# Patient Record
Sex: Male | Born: 1955
Health system: Southern US, Community
[De-identification: ages and names within clinical notes are randomized; demographics above are authoritative.]

## PROBLEM LIST (undated history)

## (undated) DIAGNOSIS — M1990 Unspecified osteoarthritis, unspecified site: Secondary | ICD-10-CM

## (undated) DIAGNOSIS — G4730 Sleep apnea, unspecified: Secondary | ICD-10-CM

## (undated) DIAGNOSIS — N189 Chronic kidney disease, unspecified: Secondary | ICD-10-CM

## (undated) DIAGNOSIS — E119 Type 2 diabetes mellitus without complications: Secondary | ICD-10-CM

## (undated) DIAGNOSIS — M109 Gout, unspecified: Secondary | ICD-10-CM

## (undated) DIAGNOSIS — K219 Gastro-esophageal reflux disease without esophagitis: Secondary | ICD-10-CM

## (undated) DIAGNOSIS — R51 Headache: Secondary | ICD-10-CM

## (undated) DIAGNOSIS — I10 Essential (primary) hypertension: Secondary | ICD-10-CM

## (undated) HISTORY — DX: Sleep apnea, unspecified: G47.30

## (undated) HISTORY — PX: OTHER SURGICAL HISTORY: SHX169

## (undated) HISTORY — PX: HERNIA REPAIR: SHX51

## (undated) HISTORY — DX: Gout, unspecified: M10.9

---

## 1999-08-21 ENCOUNTER — Encounter: Payer: Self-pay | Admitting: Urology

## 1999-08-22 ENCOUNTER — Encounter: Payer: Self-pay | Admitting: Urology

## 1999-08-22 ENCOUNTER — Ambulatory Visit (HOSPITAL_COMMUNITY): Admission: RE | Admit: 1999-08-22 | Discharge: 1999-08-22 | Payer: Self-pay | Admitting: Urology

## 2004-04-29 ENCOUNTER — Ambulatory Visit (HOSPITAL_COMMUNITY): Admission: RE | Admit: 2004-04-29 | Discharge: 2004-04-29 | Payer: Self-pay | Admitting: Family Medicine

## 2006-11-10 ENCOUNTER — Ambulatory Visit (HOSPITAL_BASED_OUTPATIENT_CLINIC_OR_DEPARTMENT_OTHER): Admission: RE | Admit: 2006-11-10 | Discharge: 2006-11-10 | Payer: Self-pay | Admitting: Urology

## 2010-12-15 ENCOUNTER — Encounter
Admission: RE | Admit: 2010-12-15 | Discharge: 2010-12-15 | Payer: Self-pay | Source: Home / Self Care | Attending: Urology | Admitting: Urology

## 2011-01-02 ENCOUNTER — Ambulatory Visit
Admission: RE | Admit: 2011-01-02 | Discharge: 2011-01-02 | Payer: Self-pay | Source: Home / Self Care | Attending: Urology | Admitting: Urology

## 2011-01-30 ENCOUNTER — Ambulatory Visit: Payer: Self-pay | Admitting: Urology

## 2011-04-24 NOTE — Op Note (Signed)
William Harvey, William Harvey               ACCOUNT NO.:  [id number]   MEDICAL RECORD NO.:  [id number]          PATIENT TYPE:  AMB   LOCATION:  [facility]                         FACILITY:  [facility]   PHYSICIAN:  Excell Seltzer. Annabell Howells, M.D.    DATE OF BIRTH:  December 30, 1955   DATE OF PROCEDURE:  11/10/2006  DATE OF DISCHARGE:                               OPERATIVE REPORT   PROCEDURE:  Right ureteroscopic stone extraction.   PREOPERATIVE DIAGNOSIS:  Right ureterovesical stone.   POSTOPERATIVE DIAGNOSIS:  Right ureterovesical stone.   SURGEON:  Excell Seltzer. Annabell Howells, M.D.   ANESTHESIA:  General.   SPECIMEN:  Stone which was given to the patient.   COMPLICATIONS:  None.   INDICATIONS:  [name] is a 55 year old white male with a right UVJ  stone that has not changed its location for seven days.  He has had  intermittent pain and elected ureteroscopic stone extraction.   FINDINGS OF PROCEDURE:  The patient was given Cipro and was taken to the  operating room where a general anesthetic was induced.  He was placed in  the lithotomy position.  His perineum and genitalia were prepped with  Betadine solution.  He was draped in the usual sterile fashion.   Ureteroscopy was performed using the 6-French short scope which was  passed per urethra.  Inspection revealed a stone crowning at the right  ureteral orifices.  It was flipped into the bladder.  The ureteroscope  was then passed into the beyond the orifice.  No residual fragments were  noted.  No significant inflammatory changes were noted except at the  meatus and it was not felt a stent was needed.   The ureteroscope was removed.  A basket was passed, a fragment of the  stone was removed with the basket.  I then inserted the cystoscope  sheath with a 12 degrees lens.  Examination revealed a normal urethra,  the external sphincter was intact.  The prostatic urethra was short  without obstruction.  The bladder wall was smooth and pale with one area  of hemorrhagic  change where the bladder would have rested on the tip of  the stone.  No other abnormalities  were noted.  The remainder of the stone was then evacuated from the  bladder which was drained.  The patient was taken down from lithotomy  position, his anesthetic was reversed.  He was moved to the recovery  room in stable condition.  There were no complications.      Excell Seltzer. Annabell Howells, M.D.  Electronically Signed     JJW/MEDQ  D:  11/10/2006  T:  11/10/2006  Job:  [id number]   cc:   Fara Chute  Fax: [phone]

## 2012-11-30 IMAGING — CT CT ABD-PELV W/O CM
2 of 4 series · 17 of 46 positions shown, 19 images · non-contrast
Comparison: CT from [HOSPITAL] on 04/07/2006

CLINICAL DATA: Diffuse abdominal pain.  Gross hematuria.
Urolithiasis.

CT ABDOMEN AND PELVIS WITHOUT CONTRAST
TECHNIQUE: Multidetector CT imaging of the abdomen and pelvis was
performed following the standard protocol without intravenous
contrast.

[Series 2: renal stone w/o · axial · non-contrast · 0.80mm/px · z∈[-435,-10]mm · 14 of 95 slices shown, 16 images]
[im 5/95  soft-tissue]
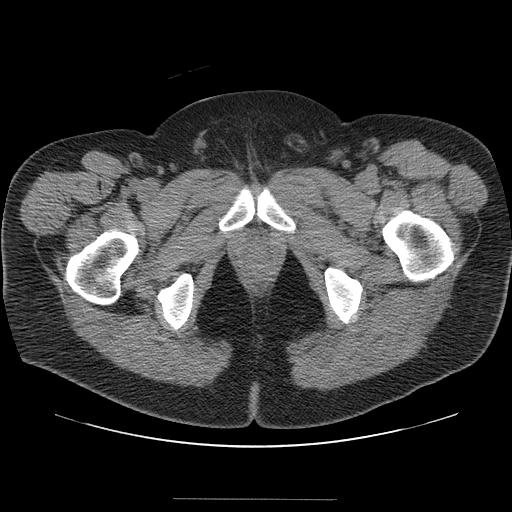
[im 5/95  bone]
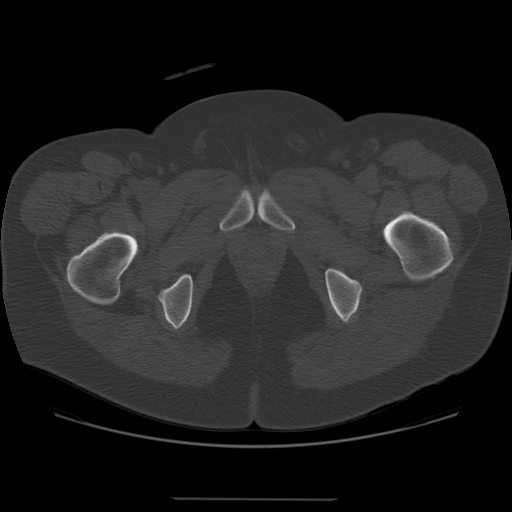
[im 13/95  soft-tissue]
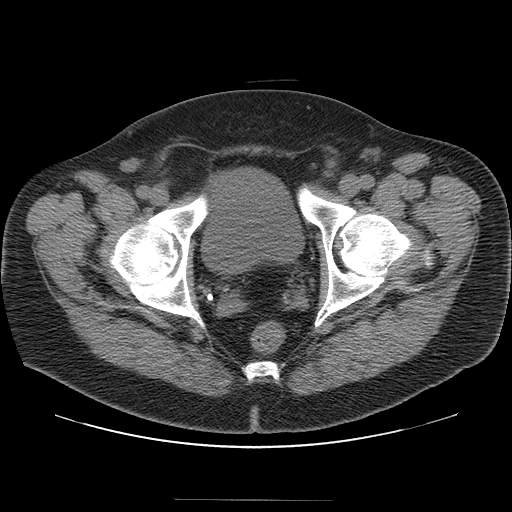
[im 17/95  soft-tissue]
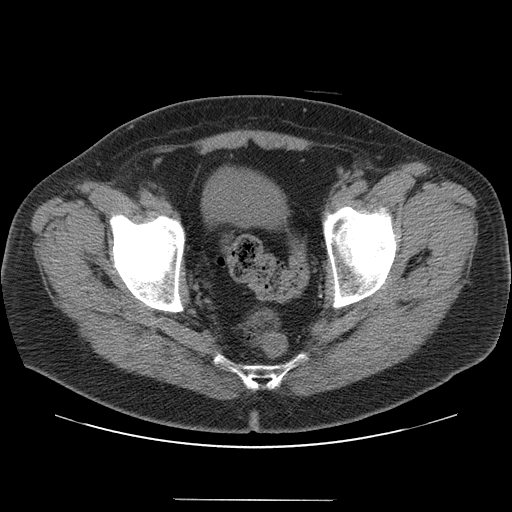
[im 25/95  soft-tissue]
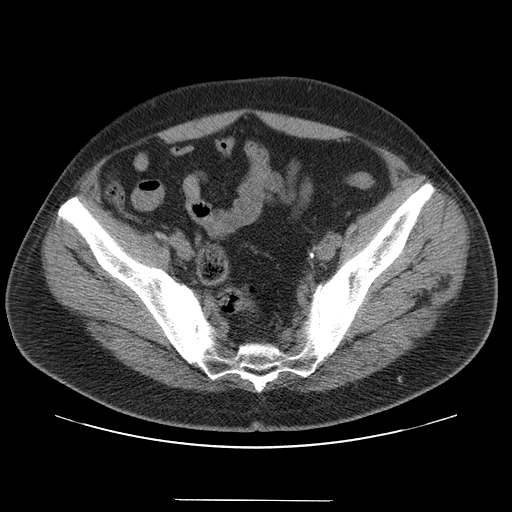
[im 33/95  soft-tissue]
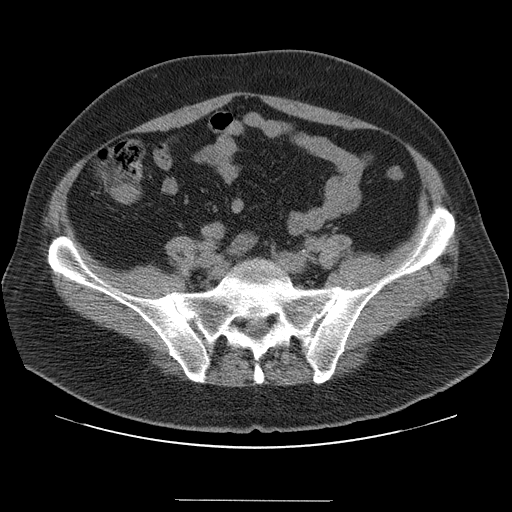
[im 37/95  soft-tissue]
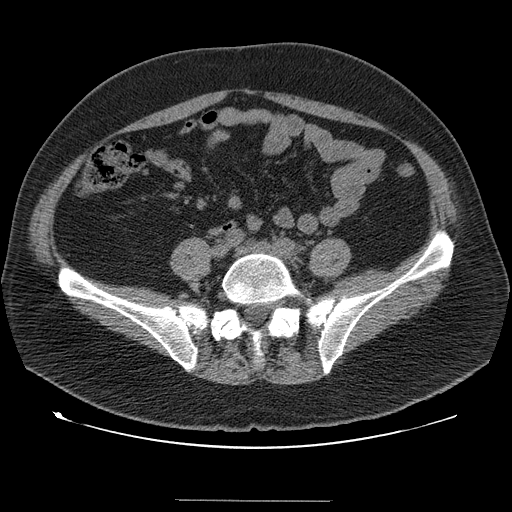
[im 45/95  soft-tissue]
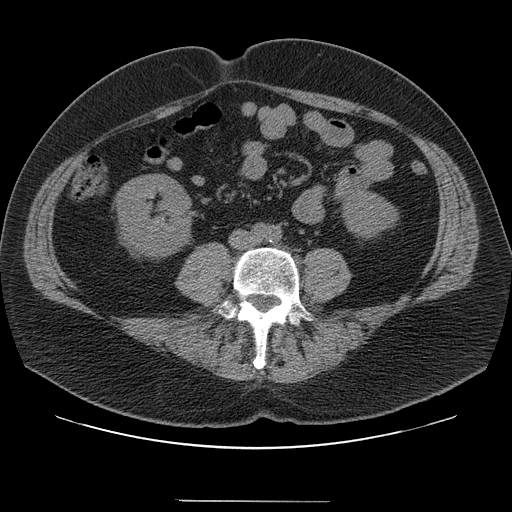
[im 50/95  soft-tissue]
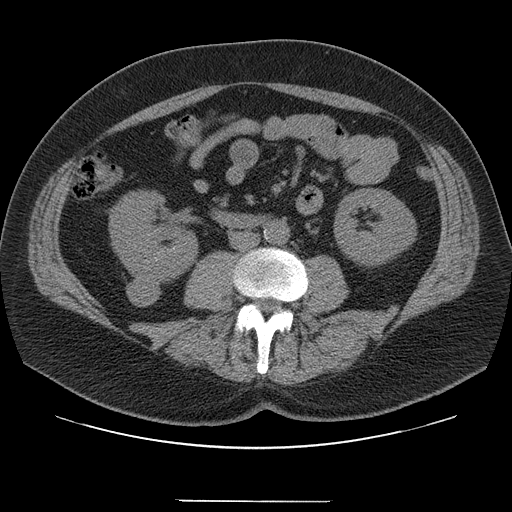
[im 58/95  soft-tissue]
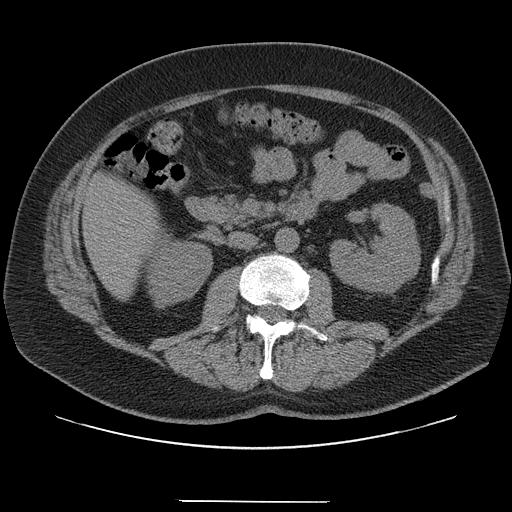
[im 58/95  bone]
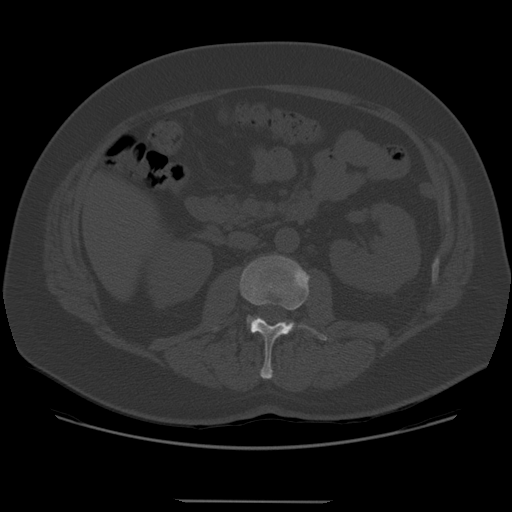
[im 62/95  soft-tissue]
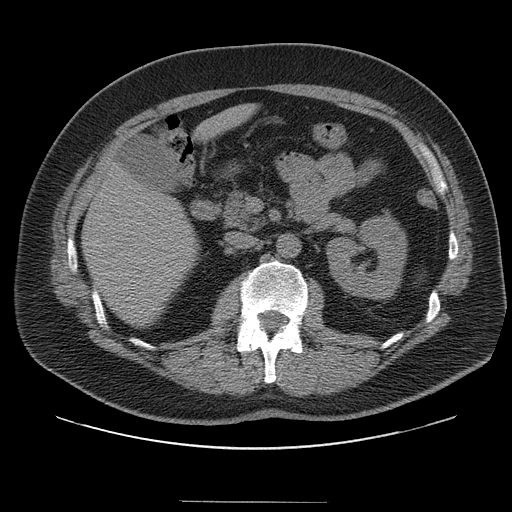
[im 70/95  soft-tissue]
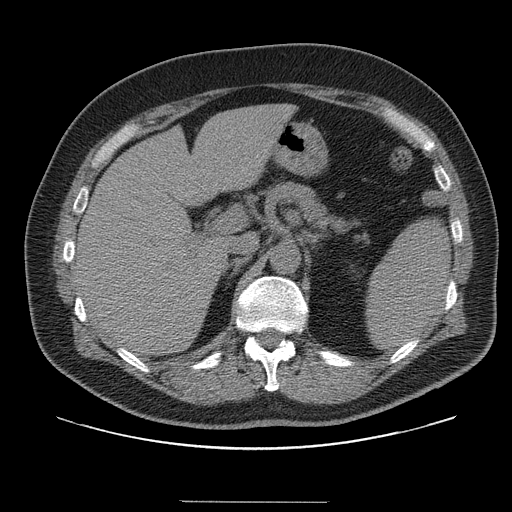
[im 78/95  soft-tissue]
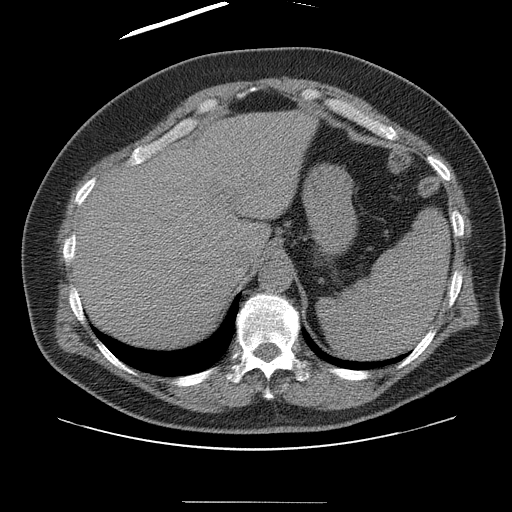
[im 82/95  soft-tissue]
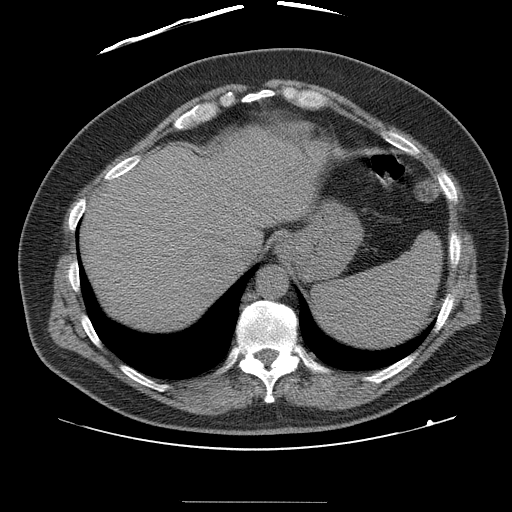
[im 90/95  soft-tissue]
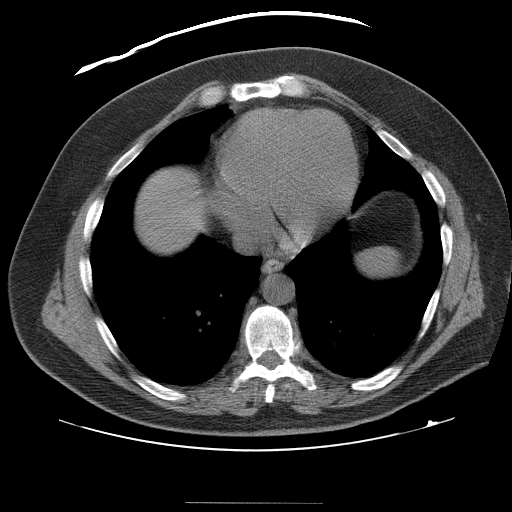

[Series 400: cor · coronal · 1.01mm/px · 3 of 132 slices shown]
[im 44/132  soft-tissue]
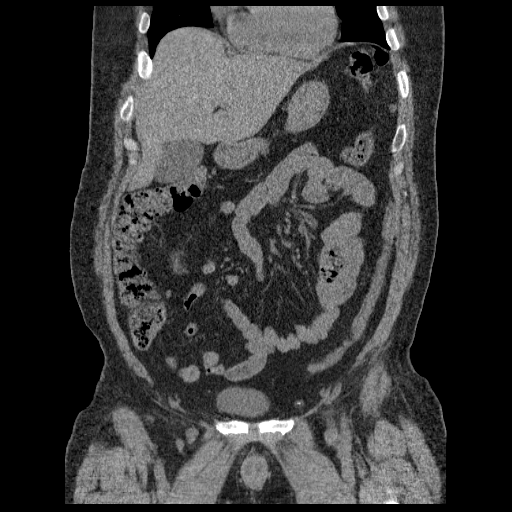
[im 59/132  soft-tissue]
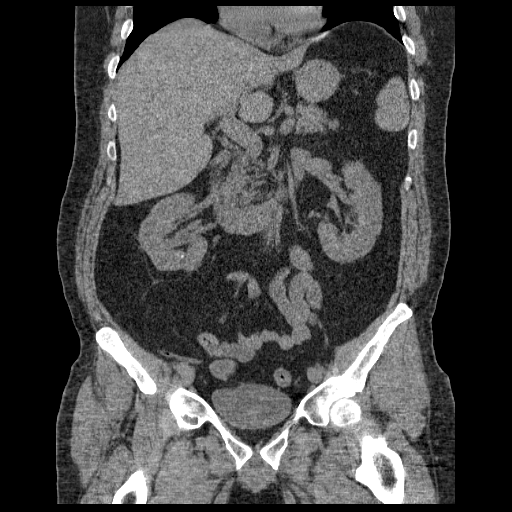
[im 73/132  soft-tissue]
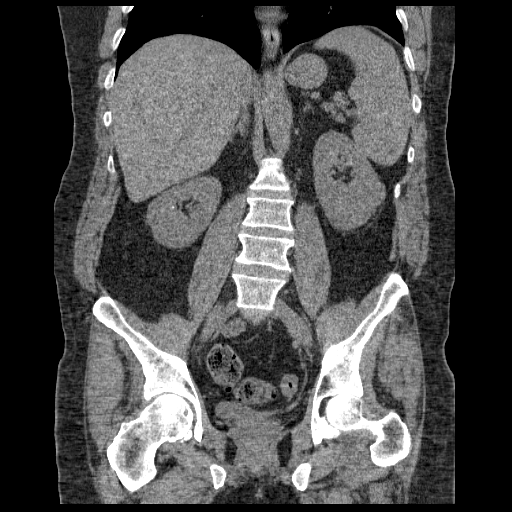

[17 of 46 positions shown; findings below may reference images not displayed]

FINDINGS: Tiny less than 5 mm calculi are noted in both kidneys,
however there is no evidence of ureteral calculi or hydronephrosis.

A 2.5 cm hyperdense cyst is again seen extending exophytically from
the posterior mid pole of the right kidney.  A smaller 1.3 cm
hyperdense subcapsular cyst is again seen along the posterior upper
pole of the left kidney.  No definite renal masses are identified
on this noncontrast exam.  No evidence of perinephric inflammatory
changes or fluid collections.

The other abdominal parenchymal organs have a normal appearance on
this noncontrast study.  Gallbladder is unremarkable.  No soft
tissue masses or lymphadenopathy identified within the abdomen or
pelvis.

There is no evidence of inflammatory process or abnormal fluid
collections.  Normal appendix is visualized.  Urinary bladder
appears normal in this noncontrast study.
IMPRESSION: 1.  No acute  findings.
2.  Nonobstructing bilateral nephrolithiasis.  No evidence of
ureteral calculi or hydronephrosis.
3.  Stable bilateral hyperdense renal cysts.

## 2013-01-09 ENCOUNTER — Telehealth: Payer: Self-pay | Admitting: Urgent Care

## 2013-01-09 NOTE — Telephone Encounter (Signed)
Called pt to triage. He will call back with med list.

## 2013-01-09 NOTE — Telephone Encounter (Signed)
Pt received a letter to be triaged for a tcs. Please call him back at [phone]

## 2013-01-10 ENCOUNTER — Telehealth: Payer: Self-pay

## 2013-01-10 ENCOUNTER — Other Ambulatory Visit: Payer: Self-pay

## 2013-01-10 DIAGNOSIS — Z139 Encounter for screening, unspecified: Secondary | ICD-10-CM

## 2013-01-10 NOTE — Telephone Encounter (Signed)
See separate triage.  

## 2013-01-10 NOTE — Telephone Encounter (Signed)
OK to proceed with colonoscopy.

## 2013-01-10 NOTE — Telephone Encounter (Signed)
Gastroenterology Pre-Procedure Form    Request Date: 01/10/2013    Requesting Physician: Dr. Regino Schultze     PATIENT INFORMATION:  William Harvey is a 57 y.o., male (DOB=19-Jul-1956).  PROCEDURE: Procedure(s) requested: colonoscopy Procedure Reason: screening for colon cancer  PATIENT REVIEW QUESTIONS: The patient reports the following:   1. Diabetes Melitis: no 2. Joint replacements in the past 12 months: no 3. Major health problems in the past 3 months: no 4. Has an artificial valve or MVP:no 5. Has been advised in past to take antibiotics in advance of a procedure like teeth cleaning: no}    MEDICATIONS & ALLERGIES:    Patient reports the following regarding taking any blood thinners:   Plavix? no Aspirin?no Coumadin?  no  Patient confirms/reports the following medications:  Current Outpatient Prescriptions  Medication Sig Dispense Refill  . lisinopril-hydrochlorothiazide (PRINZIDE,ZESTORETIC) 20-12.5 MG per tablet Take 1 tablet by mouth daily.      . meloxicam (MOBIC) 7.5 MG tablet Take 7.5 mg by mouth 2 (two) times daily.      . naproxen sodium (ANAPROX) 220 MG tablet Take 220 mg by mouth 2 (two) times daily with a meal. Pt takes two Aleve once every morning      . NON FORMULARY Lorcet  10/325 mg       KEEPS ON HAND FOR KIDNEY STONES        Patient confirms/reports the following allergies:  No Known Allergies  Patient is appropriate to schedule for requested procedure(s): yes  AUTHORIZATION INFORMATION Primary Insurance:  ID #:   Group #:  Pre-Cert / Auth required:  Pre-Cert / Auth #:   Secondary Insurance:  ID #:   Group #:  Pre-Cert / Auth required: Pre-Cert / Auth #:   No orders of the defined types were placed in this encounter.    SCHEDULE INFORMATION: Procedure has been scheduled as follows:  Date:   01/24/2013     Time:  10:30 AM Location: [facility] Short Stay  This Gastroenterology Pre-Precedure Form is being routed to the following  provider(s) for review: R. Roetta Sessions, MD

## 2013-01-11 MED ORDER — PEG-KCL-NACL-NASULF-NA ASC-C 100 G PO SOLR: 1.0000 | ORAL | Status: DC

## 2013-01-11 NOTE — Telephone Encounter (Signed)
Rx sent to [facility]. Instructions mailed to pt.

## 2013-01-13 ENCOUNTER — Encounter (HOSPITAL_COMMUNITY): Payer: Self-pay | Admitting: Pharmacy Technician

## 2013-01-24 ENCOUNTER — Encounter (HOSPITAL_COMMUNITY): Payer: Self-pay | Admitting: *Deleted

## 2013-01-24 ENCOUNTER — Ambulatory Visit (HOSPITAL_COMMUNITY)
Admission: RE | Admit: 2013-01-24 | Discharge: 2013-01-24 | Disposition: A | Discharge status: Home / Self Care | Payer: BC Managed Care – PPO | Source: Ambulatory Visit | Attending: Internal Medicine | Admitting: Internal Medicine

## 2013-01-24 ENCOUNTER — Encounter (HOSPITAL_COMMUNITY)
Admission: RE | Disposition: A | Discharge status: Home / Self Care | Payer: Self-pay | Source: Ambulatory Visit | Attending: Internal Medicine

## 2013-01-24 DIAGNOSIS — Z1211 Encounter for screening for malignant neoplasm of colon: Secondary | ICD-10-CM | POA: Insufficient documentation

## 2013-01-24 DIAGNOSIS — K633 Ulcer of intestine: Secondary | ICD-10-CM

## 2013-01-24 DIAGNOSIS — D128 Benign neoplasm of rectum: Secondary | ICD-10-CM

## 2013-01-24 DIAGNOSIS — D129 Benign neoplasm of anus and anal canal: Secondary | ICD-10-CM

## 2013-01-24 DIAGNOSIS — Z139 Encounter for screening, unspecified: Secondary | ICD-10-CM

## 2013-01-24 DIAGNOSIS — I10 Essential (primary) hypertension: Secondary | ICD-10-CM | POA: Insufficient documentation

## 2013-01-24 DIAGNOSIS — K5289 Other specified noninfective gastroenteritis and colitis: Secondary | ICD-10-CM | POA: Insufficient documentation

## 2013-01-24 HISTORY — DX: Chronic kidney disease, unspecified: N18.9

## 2013-01-24 HISTORY — DX: Gastro-esophageal reflux disease without esophagitis: K21.9

## 2013-01-24 HISTORY — DX: Unspecified osteoarthritis, unspecified site: M19.90

## 2013-01-24 HISTORY — DX: Headache: R51

## 2013-01-24 HISTORY — DX: Essential (primary) hypertension: I10

## 2013-01-24 HISTORY — PX: COLONOSCOPY: SHX5424

## 2013-01-24 SURGERY — COLONOSCOPY
Anesthesia: Moderate Sedation

## 2013-01-24 MED ORDER — ONDANSETRON HCL 4 MG/2ML IJ SOLN
INTRAMUSCULAR | Status: DC | PRN
  Administered 2013-01-24: 4 mg via INTRAVENOUS

## 2013-01-24 MED ORDER — ONDANSETRON HCL 4 MG/2ML IJ SOLN
INTRAMUSCULAR | Status: AC
  Filled 2013-01-24: qty 2

## 2013-01-24 MED ORDER — MIDAZOLAM HCL 5 MG/5ML IJ SOLN
INTRAMUSCULAR | Status: DC | PRN
  Administered 2013-01-24: 1 mg via INTRAVENOUS
  Administered 2013-01-24 (×3): 2 mg via INTRAVENOUS

## 2013-01-24 MED ORDER — MIDAZOLAM HCL 5 MG/5ML IJ SOLN
INTRAMUSCULAR | Status: AC
  Filled 2013-01-24: qty 10

## 2013-01-24 MED ORDER — MEPERIDINE HCL 100 MG/ML IJ SOLN
INTRAMUSCULAR | Status: DC | PRN
  Administered 2013-01-24: 25 mg via INTRAVENOUS
  Administered 2013-01-24 (×2): 50 mg via INTRAVENOUS
  Administered 2013-01-24: 25 mg via INTRAVENOUS

## 2013-01-24 MED ORDER — MEPERIDINE HCL 100 MG/ML IJ SOLN
INTRAMUSCULAR | Status: AC
  Filled 2013-01-24: qty 1

## 2013-01-24 MED ORDER — STERILE WATER FOR IRRIGATION IR SOLN
Status: DC | PRN
  Administered 2013-01-24: 10:00:00

## 2013-01-24 MED ORDER — SODIUM CHLORIDE 0.45 % IV SOLN
INTRAVENOUS | Status: DC
  Administered 2013-01-24: 1000 mL via INTRAVENOUS

## 2013-01-24 NOTE — Op Note (Signed)
[facility] [address] [place], [address]   COLONOSCOPY PROCEDURE REPORT  PATIENT: William Harvey, William Harvey  MR#:         [id number] BIRTHDATE: 08-10-1956 , 56  yrs. old GENDER: Male ENDOSCOPIST: R.  Roetta Sessions, MD FACP FACG REFERRED BY:  Karleen Hampshire, M.D. PROCEDURE DATE:  01/24/2013 PROCEDURE:     ileocolonoscopy with ileal biopsy, snare polypectomy and polyp ablation  INDICATIONS: First ever average risk screening colonoscopy  INFORMED CONSENT:  The risks, benefits, alternatives and imponderables including but not limited to bleeding, perforation as well as the possibility of a missed lesion have been reviewed.  The potential for biopsy, lesion removal, etc. have also been discussed.  Questions have been answered.  All parties agreeable. Please see the history and physical in the medical record for more information.  MEDICATIONS: Versed 7 mg IV and Demerol 150 mg IV in divided doses. Zofran 4 mg IV  DESCRIPTION OF PROCEDURE:  After a digital rectal exam was performed, the Pentax Colonoscope [id number]  colonoscope was advanced from the anus through the rectum and colon to the area of the cecum, ileocecal valve and appendiceal orifice.  The cecum was deeply intubated.  These structures were well-seen and photographed for the record.  From the level of the cecum and ileocecal valve, the scope was slowly and cautiously withdrawn.  The mucosal surfaces were carefully surveyed utilizing scope tip deflection to facilitate fold flattening as needed.  The scope was pulled down into the rectum where a thorough examination including retroflexion was performed.    FINDINGS:  Adequate preparation. (1) 5 mm pedunculated polyp just inside the anal verge at 2 cm. (2) adjacent diminutive polyps in the distal rectum as well. The remainder of the rectal and colonic mucosa appeared normal. The patient had multiple 1-3 mm ulcers in the terminal ileum.  THERAPEUTIC /  DIAGNOSTIC MANEUVERS PERFORMED:  The terminal ileum was biopsied. The larger rectal polyp was hot snare removed. The 2 adjacent polyps were ablated with the tip of a hot snare loop.  COMPLICATIONS: None  CECAL WITHDRAWAL TIME:  15 minute  IMPRESSION:  Ileal ulcers-likely related to nonsteroidal use-status post biopsy. Rectal polyps removed and/or ablated as described above.  RECOMMENDATIONS: Followup on pathology.   _______________________________ eSigned:  R. Roetta Sessions, MD FACP [facility] 01/24/2013 11:19 AM   CC:    PATIENT NAME:  [name], [name] MR#: [id number]
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
## 2013-01-26 ENCOUNTER — Encounter: Payer: Self-pay | Admitting: *Deleted

## 2013-01-26 ENCOUNTER — Encounter: Payer: Self-pay | Admitting: Internal Medicine

## 2013-01-27 ENCOUNTER — Encounter (HOSPITAL_COMMUNITY): Payer: Self-pay | Admitting: Internal Medicine

## 2013-12-25 ENCOUNTER — Other Ambulatory Visit (HOSPITAL_COMMUNITY): Payer: Self-pay | Admitting: "Endocrinology

## 2013-12-25 DIAGNOSIS — E049 Nontoxic goiter, unspecified: Secondary | ICD-10-CM

## 2014-01-02 ENCOUNTER — Ambulatory Visit (HOSPITAL_COMMUNITY)
Admission: RE | Admit: 2014-01-02 | Discharge: 2014-01-02 | Disposition: A | Discharge status: Home / Self Care | Payer: BC Managed Care – PPO | Source: Ambulatory Visit | Attending: "Endocrinology | Admitting: "Endocrinology

## 2014-01-02 ENCOUNTER — Ambulatory Visit (HOSPITAL_COMMUNITY): Payer: BC Managed Care – PPO

## 2014-01-02 DIAGNOSIS — E049 Nontoxic goiter, unspecified: Secondary | ICD-10-CM

## 2014-01-02 DIAGNOSIS — E042 Nontoxic multinodular goiter: Secondary | ICD-10-CM | POA: Insufficient documentation

## 2014-01-08 ENCOUNTER — Other Ambulatory Visit (HOSPITAL_COMMUNITY): Payer: Self-pay | Admitting: "Endocrinology

## 2014-01-08 DIAGNOSIS — E042 Nontoxic multinodular goiter: Secondary | ICD-10-CM

## 2014-01-10 ENCOUNTER — Encounter (HOSPITAL_COMMUNITY): Payer: Self-pay

## 2014-01-10 ENCOUNTER — Encounter (HOSPITAL_COMMUNITY)
Admission: RE | Admit: 2014-01-10 | Discharge: 2014-01-10 | Disposition: A | Discharge status: Home / Self Care | Payer: BC Managed Care – PPO | Source: Ambulatory Visit | Attending: "Endocrinology | Admitting: "Endocrinology

## 2014-01-10 DIAGNOSIS — E042 Nontoxic multinodular goiter: Secondary | ICD-10-CM | POA: Insufficient documentation

## 2014-01-10 MED ORDER — SODIUM IODIDE I 131 CAPSULE
9.0000 | Freq: Once | INTRAVENOUS | Status: AC | PRN
  Administered 2014-01-10: 9 via ORAL

## 2014-01-11 ENCOUNTER — Encounter (HOSPITAL_COMMUNITY)
Admission: RE | Admit: 2014-01-11 | Discharge: 2014-01-11 | Disposition: A | Discharge status: Home / Self Care | Payer: BC Managed Care – PPO | Source: Ambulatory Visit | Attending: "Endocrinology | Admitting: "Endocrinology

## 2014-01-11 MED ORDER — SODIUM PERTECHNETATE TC 99M INJECTION
10.0000 | Freq: Once | INTRAVENOUS | Status: AC | PRN
  Administered 2014-01-11: 10 via INTRAVENOUS

## 2014-01-17 ENCOUNTER — Other Ambulatory Visit (HOSPITAL_COMMUNITY): Payer: Self-pay | Admitting: "Endocrinology

## 2014-01-17 DIAGNOSIS — R221 Localized swelling, mass and lump, neck: Secondary | ICD-10-CM

## 2014-01-25 ENCOUNTER — Other Ambulatory Visit (HOSPITAL_COMMUNITY): Payer: Self-pay | Admitting: "Endocrinology

## 2014-01-25 ENCOUNTER — Encounter (HOSPITAL_COMMUNITY): Payer: Self-pay

## 2014-01-25 ENCOUNTER — Ambulatory Visit (HOSPITAL_COMMUNITY)
Admission: RE | Admit: 2014-01-25 | Discharge: 2014-01-25 | Disposition: A | Discharge status: Home / Self Care | Payer: BC Managed Care – PPO | Source: Ambulatory Visit | Attending: "Endocrinology | Admitting: "Endocrinology

## 2014-01-25 DIAGNOSIS — E049 Nontoxic goiter, unspecified: Secondary | ICD-10-CM | POA: Insufficient documentation

## 2014-01-25 DIAGNOSIS — R221 Localized swelling, mass and lump, neck: Secondary | ICD-10-CM

## 2014-01-25 MED ORDER — LIDOCAINE HCL (PF) 2 % IJ SOLN
10.0000 mL | Freq: Once | INTRAMUSCULAR | Status: AC
  Administered 2014-01-25: 10 mL

## 2014-01-25 MED ORDER — LIDOCAINE HCL (PF) 2 % IJ SOLN
INTRAMUSCULAR | Status: AC
  Administered 2014-01-25: 10 mL
  Filled 2014-01-25: qty 10

## 2014-01-25 NOTE — Discharge Instructions (Signed)
Thyroid Biopsy °The thyroid gland is a butterfly-shaped gland situated in the front of the neck. It produces hormones which affect metabolism, growth and development, and body temperature. A thyroid biopsy is a procedure in which small samples of tissue or fluid are removed from the thyroid gland or mass and examined under a microscope. This test is done to determine the cause of thyroid problems, such as infection, cancer, or other thyroid problems. °There are 2 ways to obtain samples: °1. Fine needle biopsy. Samples are removed using a thin needle inserted through the skin and into the thyroid gland or mass. °2. Open biopsy. Samples are removed after a cut (incision) is made through the skin. °LET YOUR CAREGIVER KNOW ABOUT:  °· Allergies. °· Medications taken including herbs, eye drops, over-the-counter medications, and creams. °· Use of steroids (by mouth or creams). °· Previous problems with anesthetics or numbing medicine. °· Possibility of pregnancy, if this applies. °· History of blood clots (thrombophlebitis). °· History of bleeding or blood problems. °· Previous surgery. °· Other health problems. °RISKS AND COMPLICATIONS °· Bleeding from the site. The risk of bleeding is higher if you have a bleeding disorder or are taking any blood thinning medications (anticoagulants). °· Infection. °· Injury to structures near the thyroid gland. °BEFORE THE PROCEDURE  °This is a procedure that can be done as an outpatient. Confirm the time that you need to arrive for your procedure. Confirm whether there is a need to fast or withhold any medications. A blood sample may be done to determine your blood clotting time. Medicine may be given to help you relax (sedative). °PROCEDURE °Fine needle biopsy. °You will be awake during the procedure. You may be asked to lie on your back with your head tipped backward to extend your neck. Let your caregiver know if you cannot tolerate the positioning. An area on your neck will be  cleansed. A needle is inserted through the skin of your neck. You may feel a mild discomfort during this procedure. You may be asked to avoid coughing, talking, swallowing, or making sounds during some portions of the procedure. The needle is withdrawn once tissue or fluid samples have been removed. Pressure may be applied to the neck to reduce swelling and ensure that bleeding has stopped. The samples will be sent for examination.  °Open biopsy. °You will be given general anesthesia. You will be asleep during the procedure. An incision is made in your neck. A sample of thyroid tissue or the mass is removed. The tissue sample or mass will be sent for examination. The sample or mass may be examined during the biopsy. If the sample or mass contains cancer cells, some or all of the thyroid gland may be removed. The incision is closed with stitches. °AFTER THE PROCEDURE  °Your recovery will be assessed and monitored. If there are no problems, as an outpatient, you should be able to go home shortly after the procedure. °If you had a fine needle biopsy: °· You may have soreness at the biopsy site for 1 to 2 days. °If you had an open biopsy:  °· You may have soreness at the biopsy site for 3 to 4 days. °· You may have a hoarse voice or sore throat for 1 to 2 days. °Obtaining the Test Results °It is your responsibility to obtain your test results. Do not assume everything is normal if you have not heard from your caregiver or the medical facility. It is important for you to follow up   on all of your test results. °HOME CARE INSTRUCTIONS  °· Keeping your head raised on a pillow when you are lying down may ease biopsy site discomfort. °· Supporting the back of your head and neck with both hands as you sit up from a lying position may ease biopsy site discomfort. °· Only take over-the-counter or prescription medicines for pain, discomfort, or fever as directed by your caregiver. °· Throat lozenges or gargling with warm salt  water may help to soothe a sore throat. °SEEK IMMEDIATE MEDICAL CARE IF:  °· You have severe bleeding from the biopsy site. °· You have difficulty swallowing. °· You have a fever. °· You have increased pain, swelling, redness, or warmth at the biopsy site. °· You notice pus coming from the biopsy site. °· You have swollen glands (lymph nodes) in your neck. °Document Released: 09/20/2007 Document Revised: 03/20/2013 Document Reviewed: 02/20/2009 °ExitCare® Patient Information ©2014 [employer]. ° °

## 2014-01-25 NOTE — Procedures (Signed)
PreOperative Dx: Multiple thyroid nodules, dominant mass inferior pole right lobe Postoperative Dx: Multiple thyroid nodules, dominant mass inferior pole right lobe Procedure:   US guided FNA of right lower pole thyroid nodule Radiologist:  Thornton Papas Anesthesia:  5 ml of 2 lidocaine Specimen:  FNA x 4  EBL:   < 1 ml Complications: None

## 2014-01-25 NOTE — Progress Notes (Signed)
Biopsy complete no signs of distress  

## 2014-02-05 ENCOUNTER — Other Ambulatory Visit (HOSPITAL_COMMUNITY): Payer: Self-pay | Admitting: "Endocrinology

## 2014-02-05 DIAGNOSIS — E042 Nontoxic multinodular goiter: Secondary | ICD-10-CM

## 2014-02-08 ENCOUNTER — Other Ambulatory Visit (HOSPITAL_COMMUNITY)
Admission: RE | Admit: 2014-02-08 | Discharge: 2014-02-08 | Disposition: A | Discharge status: Home / Self Care | Payer: BC Managed Care – PPO | Source: Ambulatory Visit | Attending: Diagnostic Radiology | Admitting: Diagnostic Radiology

## 2014-02-08 ENCOUNTER — Ambulatory Visit (HOSPITAL_COMMUNITY): Payer: BC Managed Care – PPO

## 2014-02-08 ENCOUNTER — Ambulatory Visit
Admission: RE | Admit: 2014-02-08 | Discharge: 2014-02-08 | Disposition: A | Discharge status: Home / Self Care | Payer: BC Managed Care – PPO | Source: Ambulatory Visit | Attending: "Endocrinology | Admitting: "Endocrinology

## 2014-02-08 DIAGNOSIS — E049 Nontoxic goiter, unspecified: Secondary | ICD-10-CM | POA: Insufficient documentation

## 2014-02-08 DIAGNOSIS — E042 Nontoxic multinodular goiter: Secondary | ICD-10-CM

## 2016-01-11 IMAGING — US US THYROID BIOPSY
1 series · 13 of 25 positions shown · non-contrast
Comparison: Thyroid ultrasound 01/02/2014

ADDENDUM:
Cytology results reviewed.

Pathology results are nondiagnostic, blood with insufficient
epithelial cells noted on specimen.
This is despite utilizing 22 gauge spinal needles in order to reach
the center of the inferior pole right lobe mass secondary to nodule
depth and neck morphology.
Mass remains indeterminate; recommend referral of patient to
CLINICAL DATA: Multinodular thyroid gland with a dominant solid
mass at the inferior pole of the right lobe
EXAM:
ULTRASOUND GUIDED NEEDLE ASPIRATE BIOPSY OF THE THYROID GLAND

[Series 1: us thyroid biopsy · 0.08mm/px · 26 acquisitions, 13 frames shown]
[im 1/26]
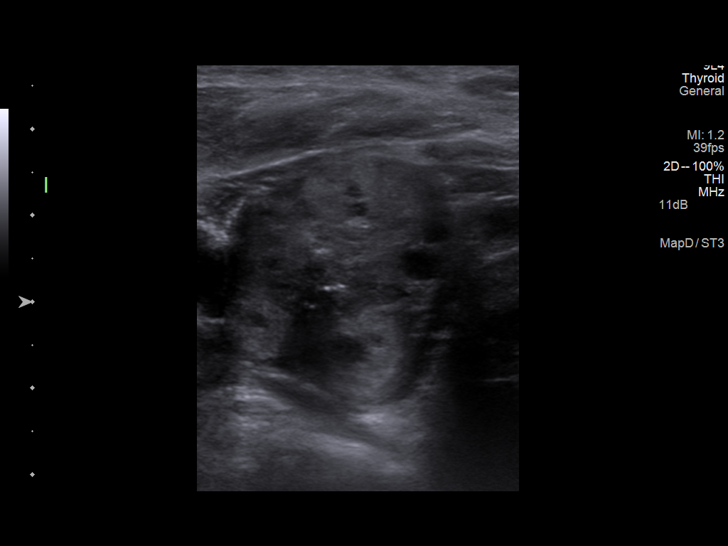
[im 3/26]
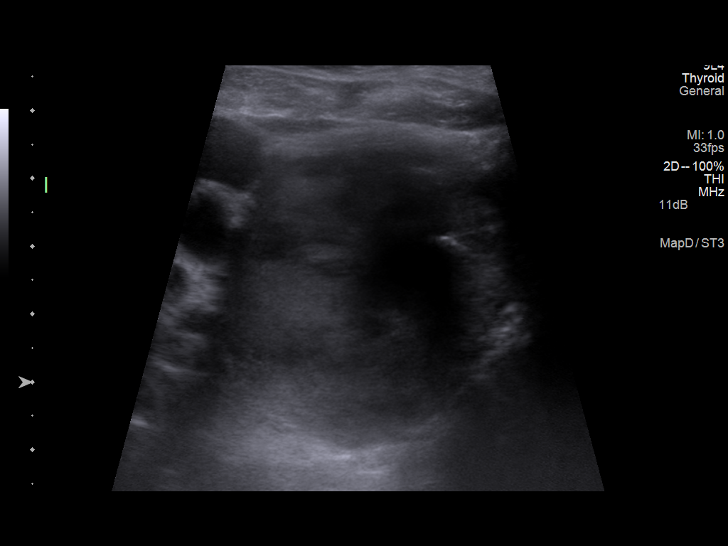
[im 5/26]
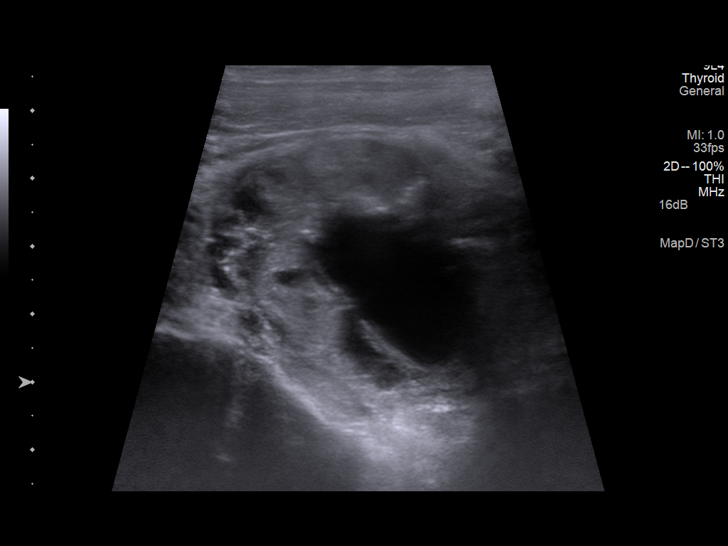
[im 7/26]
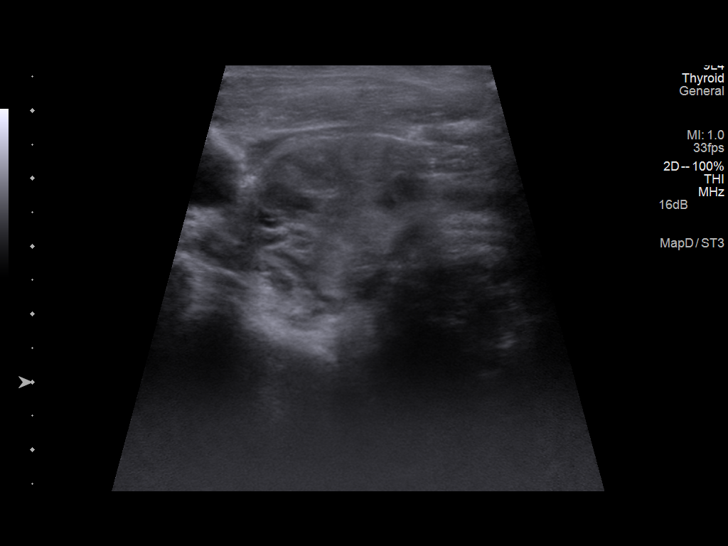
[im 9/26]
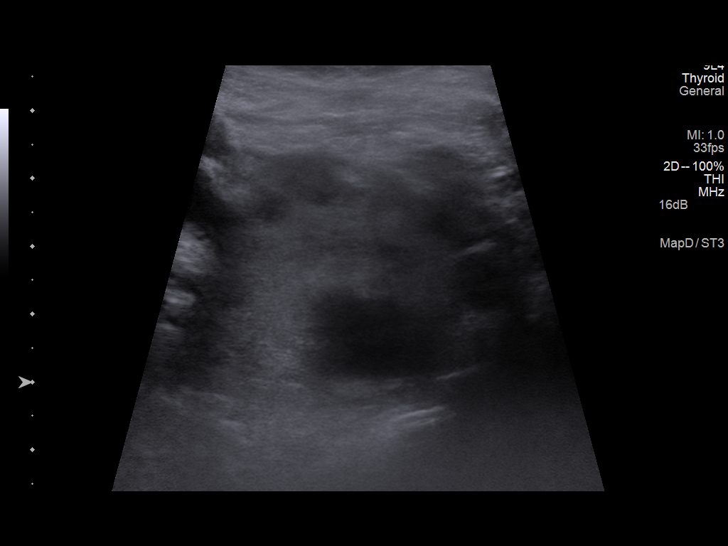
[im 11/26]
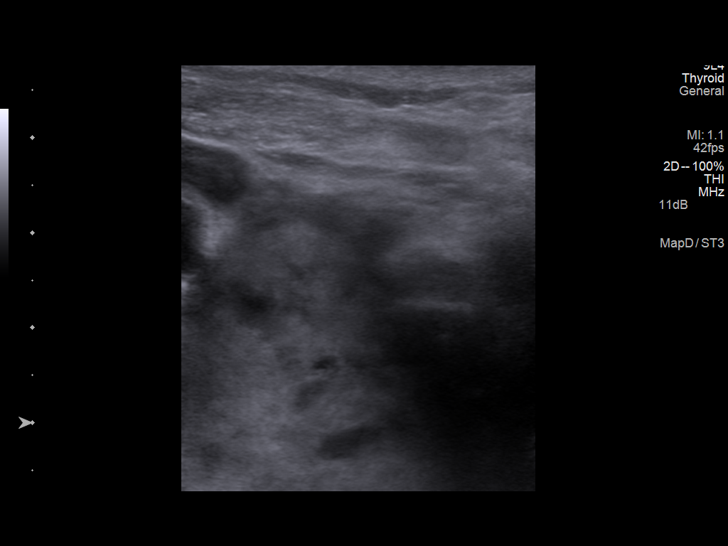
[im 13/26]
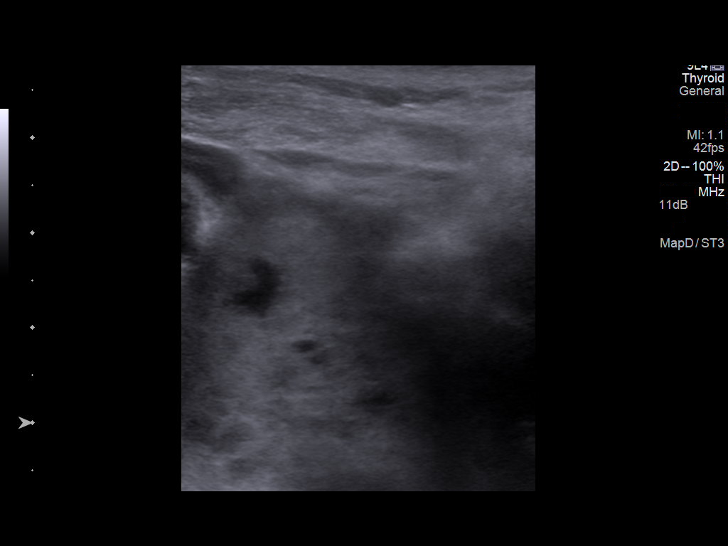
[im 15/26]
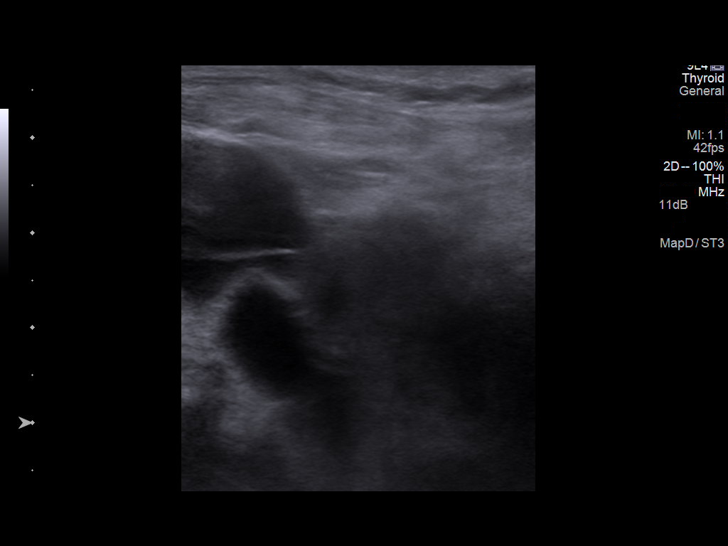
[im 17/26]
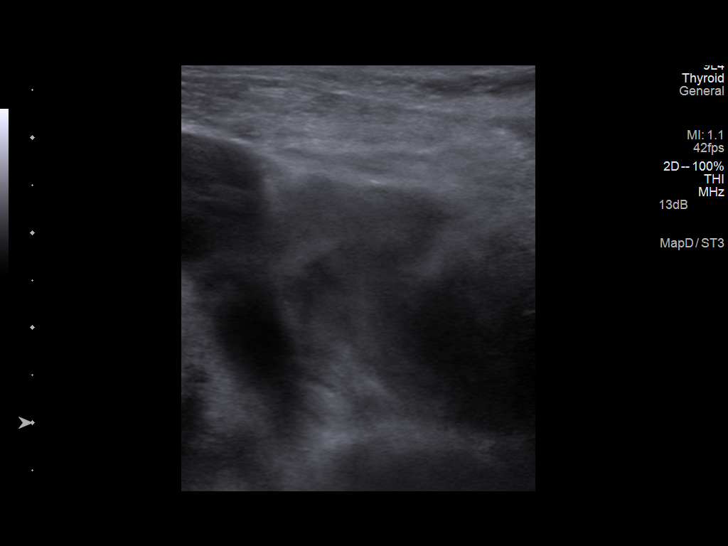
[im 19/26]
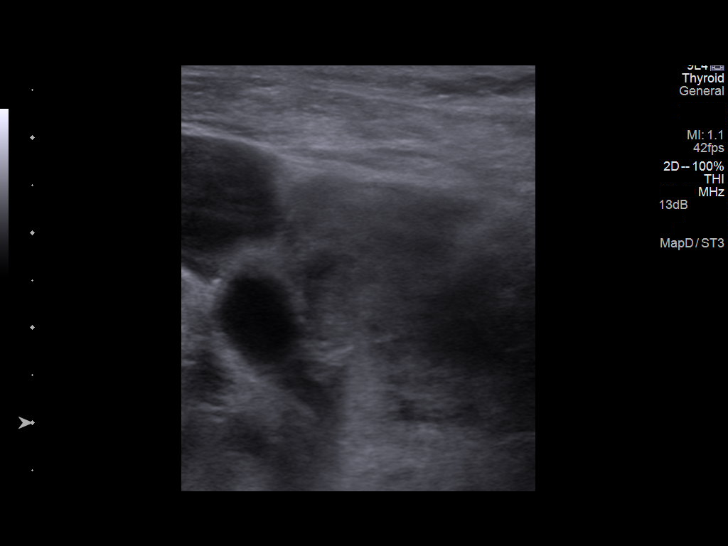
[im 21/26]
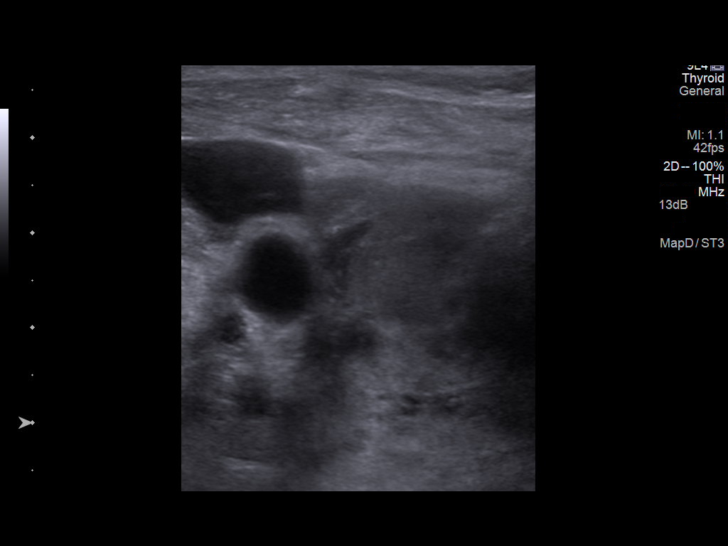
[im 23/26]
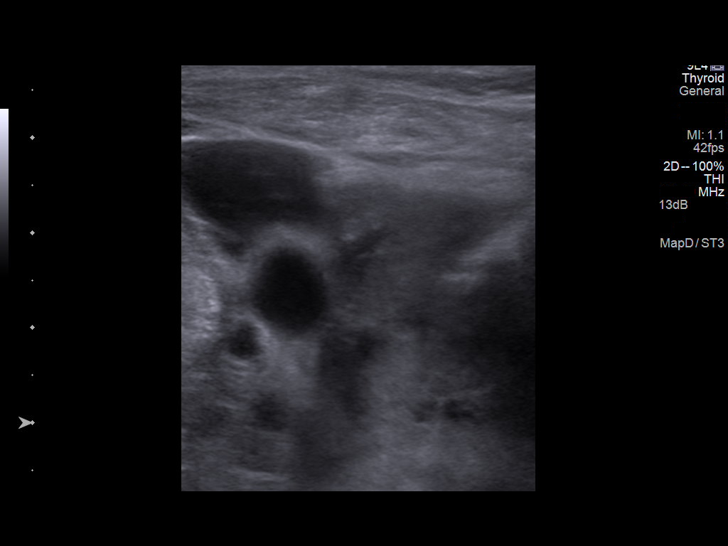
[im 26/26]
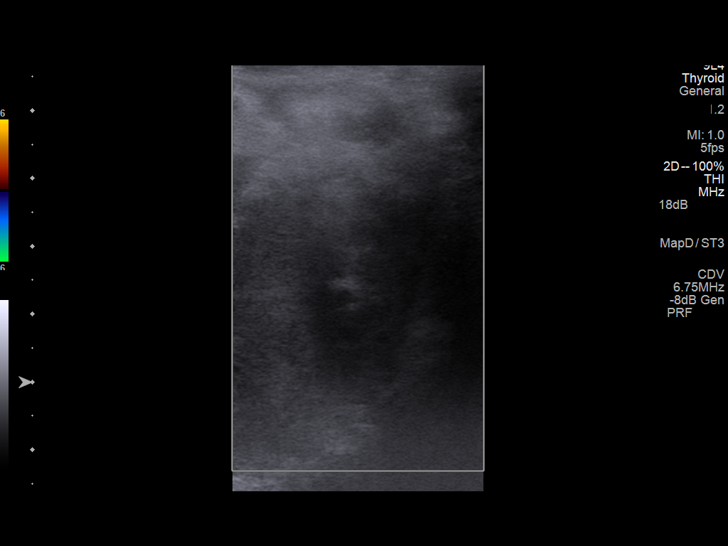

[13 of 25 positions shown; findings below may reference images not displayed]

FINDINGS: As above
IMPRESSION: Ultrasound guided needle aspirate biopsy performed of the dominant
inferior pole right thyroid nodule.

PROCEDURE:
Procedure was discussed with the patient including risks and
benefits.

Patient's questions were answered.

Written informed consent for the procedure was obtained.

Timeout protocol followed.

Dominant mass at inferior pole right thyroid lobe was localized
sonographically.

Mass measures up to 4.3 cm in greatest dimension.

Skin was prepped and draped in usual sterile fashion.

Skin and soft tissues were anesthetized with 5 mL is of 2%
lidocaine.

A 25 gauge needle was advanced into the thyroid nodule.

However due to the depth of the nodule and size of patient's neck,
this needle was insufficient in length for adequate sampling,
reaching only the very anterior aspect of the nodule.

Subsequently, 3 samples were obtained by fine-needle aspiration
utilizing 22 gauge spinal needles.

No immediate complications.

No evidence of hematoma post procedural images.

Routine postprocedural instructions and information were given to
the patient.

## 2016-01-25 IMAGING — US US THYROID BIOPSY
1 series · 13 of 21 positions shown · non-contrast
Comparison: 01/02/2014

CLINICAL DATA: Previous right thyroid biopsy was non-diagnostic.

EXAM:
ULTRASOUND GUIDED NEEDLE ASPIRATE BIOPSY OF THE THYROID GLAND

[Series 1: us thyroid biopsy · 0.11mm/px · 21 acquisitions, 13 frames shown]
[im 1/21]
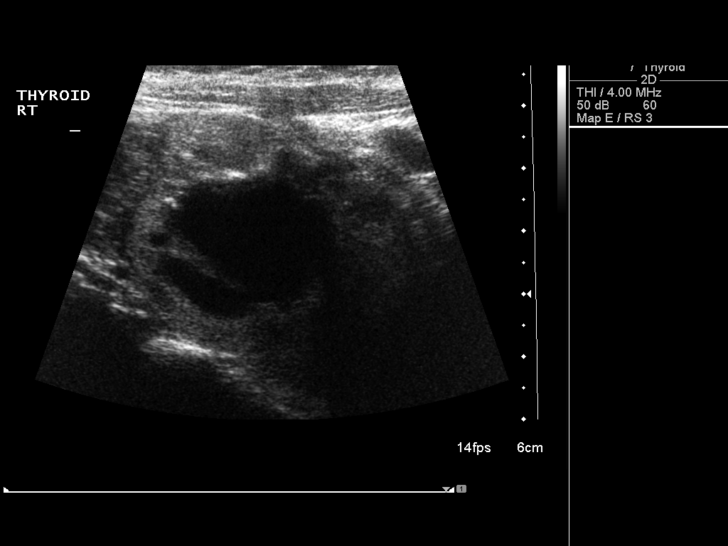
[im 3/21]
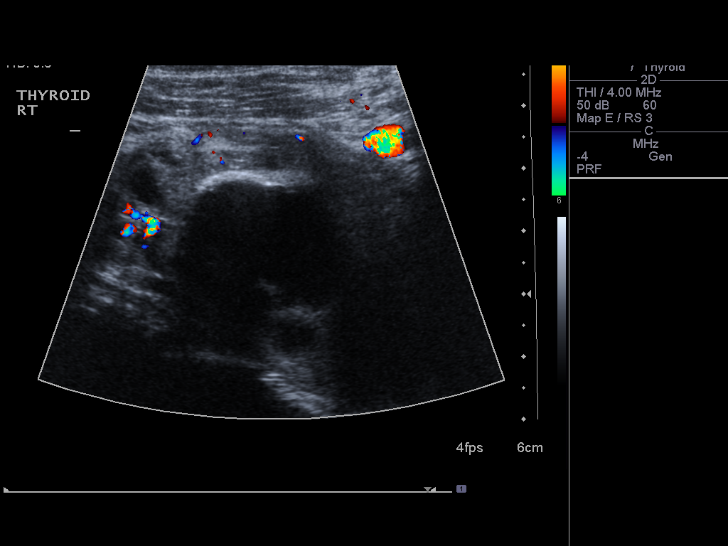
[im 5/21]
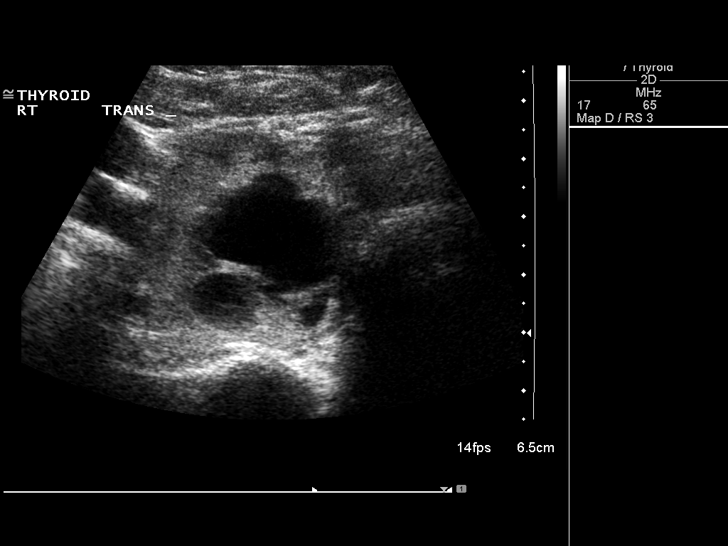
[im 6/21]
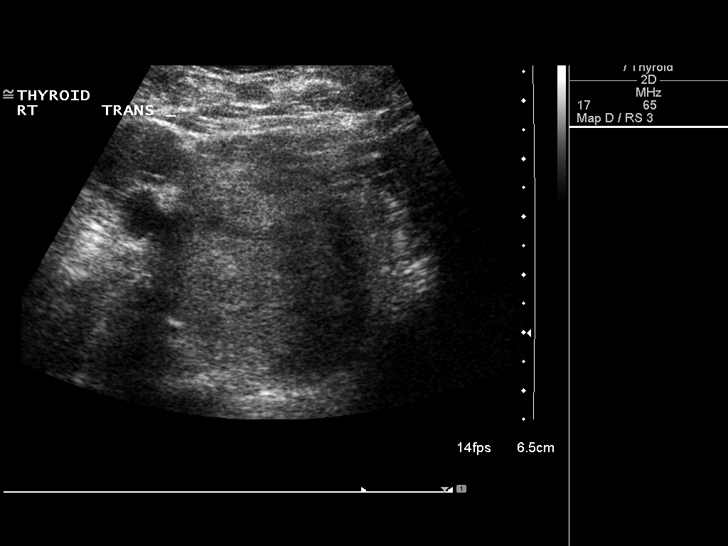
[im 8/21]
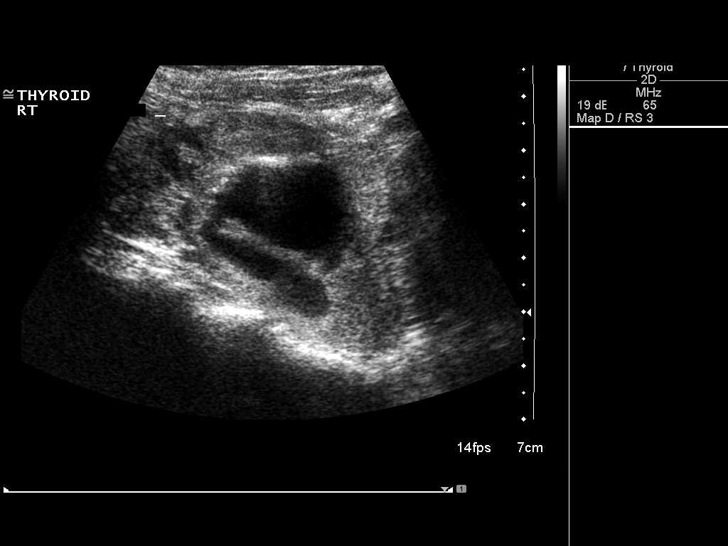
[im 9/21]
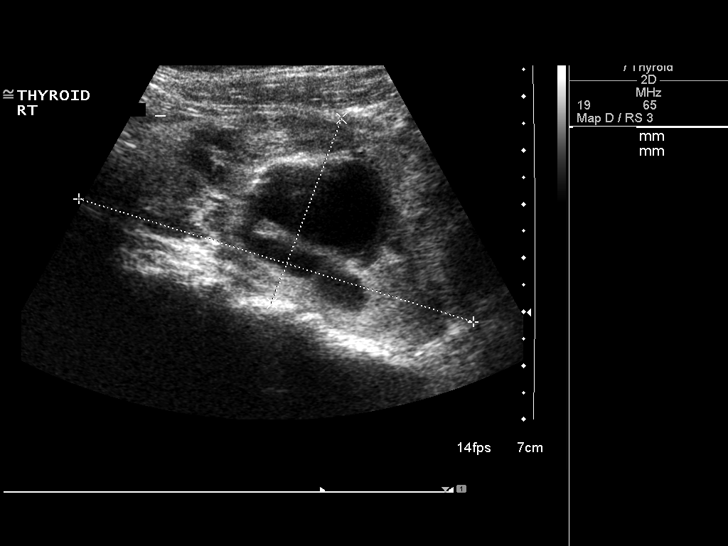
[im 11/21]
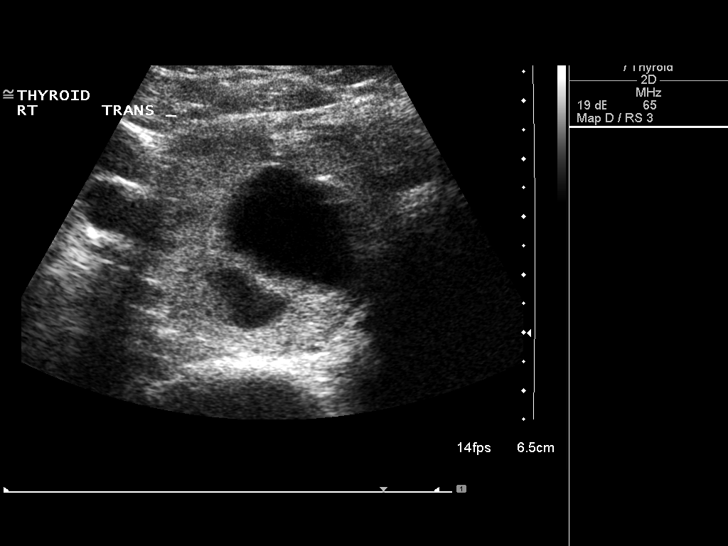
[im 13/21]
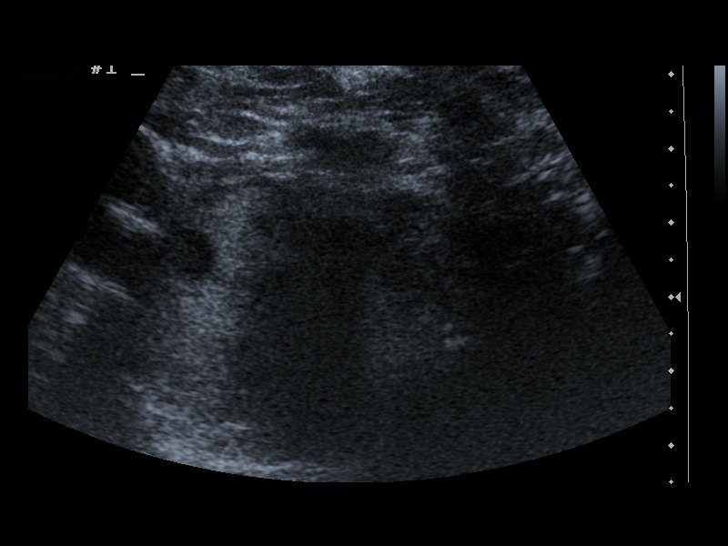
[im 14/21]
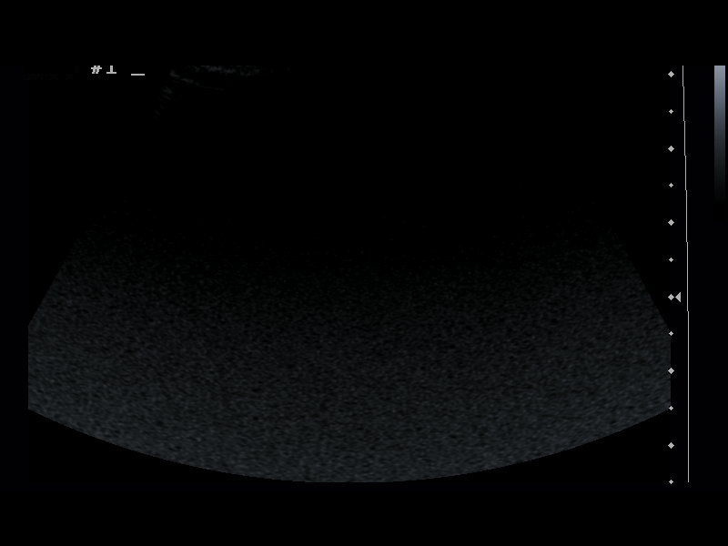
[im 16/21]
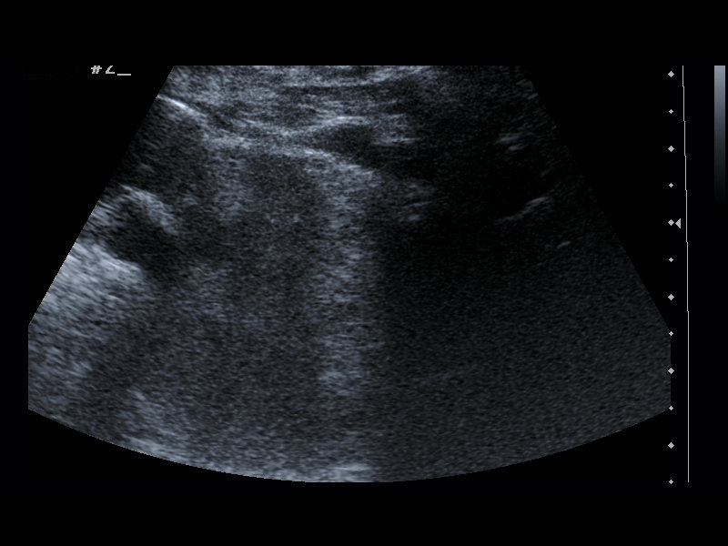
[im 17/21]
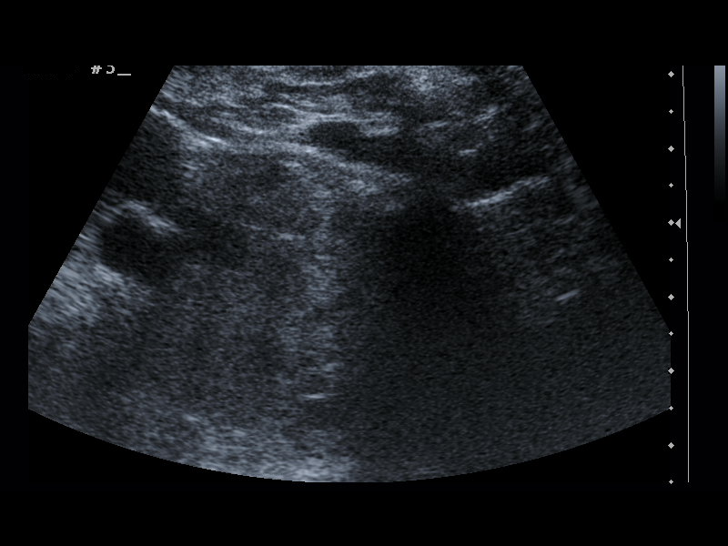
[im 19/21]
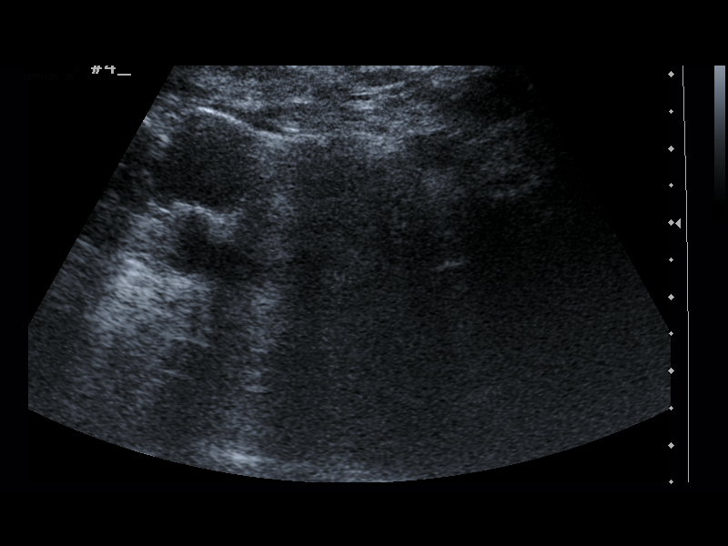
[im 21/21]
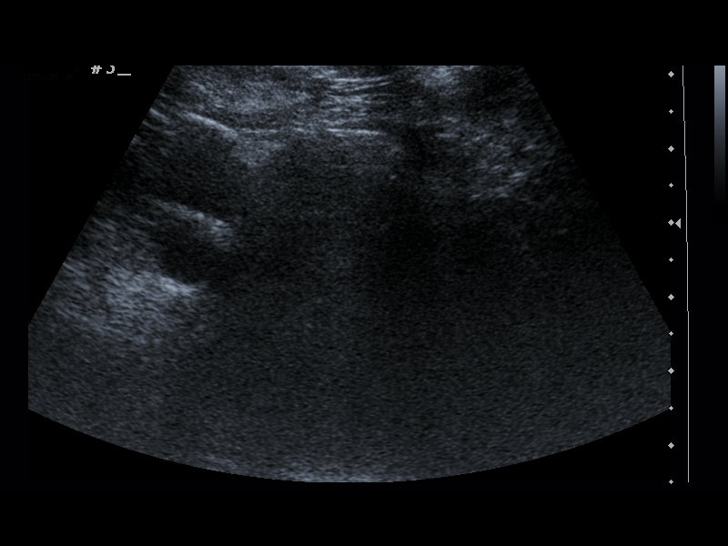

[13 of 21 positions shown; findings below may reference images not displayed]

FINDINGS: The right thyroid lobe is very heterogeneous. There is a cystic
component within the mid aspect. There is a solid component
involving the inferior right thyroid lobe. The inferior right
thyroid nodule or component was biopsied.
IMPRESSION: Ultrasound guided needle aspirate biopsy performed of the right
inferior thyroid nodule.

PROCEDURE:
Thyroid biopsy was thoroughly discussed with the patient and
questions were answered. The benefits, risks, alternatives, and
complications were also discussed. The patient understands and
wishes to proceed with the procedure. Written consent was obtained.

Ultrasound was performed to localize and mark an adequate site for
the biopsy. The patient was then prepped and draped in a normal
sterile fashion. Local anesthesia was provided with 1% lidocaine.
Using direct ultrasound guidance, 5 passes were made using needles
into the nodule within the right lobe of the thyroid. Ultrasound was
used to confirm needle placements on all occasions. Specimens were
sent to Pathology for analysis.

Complications:  None

## 2017-12-10 ENCOUNTER — Encounter: Payer: Self-pay | Admitting: Internal Medicine

## 2018-01-27 DIAGNOSIS — K4091 Unilateral inguinal hernia, without obstruction or gangrene, recurrent: Secondary | ICD-10-CM | POA: Diagnosis not present

## 2018-02-01 DIAGNOSIS — Z87891 Personal history of nicotine dependence: Secondary | ICD-10-CM | POA: Diagnosis not present

## 2018-02-01 DIAGNOSIS — Z79899 Other long term (current) drug therapy: Secondary | ICD-10-CM | POA: Diagnosis not present

## 2018-02-01 DIAGNOSIS — Z7984 Long term (current) use of oral hypoglycemic drugs: Secondary | ICD-10-CM | POA: Diagnosis not present

## 2018-02-01 DIAGNOSIS — I10 Essential (primary) hypertension: Secondary | ICD-10-CM | POA: Diagnosis not present

## 2018-02-01 DIAGNOSIS — M1990 Unspecified osteoarthritis, unspecified site: Secondary | ICD-10-CM | POA: Diagnosis not present

## 2018-02-01 DIAGNOSIS — K219 Gastro-esophageal reflux disease without esophagitis: Secondary | ICD-10-CM | POA: Diagnosis not present

## 2018-02-01 DIAGNOSIS — K4091 Unilateral inguinal hernia, without obstruction or gangrene, recurrent: Secondary | ICD-10-CM | POA: Diagnosis not present

## 2018-02-04 DIAGNOSIS — M1990 Unspecified osteoarthritis, unspecified site: Secondary | ICD-10-CM | POA: Diagnosis not present

## 2018-02-04 DIAGNOSIS — Z87891 Personal history of nicotine dependence: Secondary | ICD-10-CM | POA: Diagnosis not present

## 2018-02-04 DIAGNOSIS — Z7984 Long term (current) use of oral hypoglycemic drugs: Secondary | ICD-10-CM | POA: Diagnosis not present

## 2018-02-04 DIAGNOSIS — I10 Essential (primary) hypertension: Secondary | ICD-10-CM | POA: Diagnosis not present

## 2018-02-04 DIAGNOSIS — K4020 Bilateral inguinal hernia, without obstruction or gangrene, not specified as recurrent: Secondary | ICD-10-CM | POA: Diagnosis not present

## 2018-02-04 DIAGNOSIS — K4091 Unilateral inguinal hernia, without obstruction or gangrene, recurrent: Secondary | ICD-10-CM | POA: Diagnosis not present

## 2018-02-04 DIAGNOSIS — Z79899 Other long term (current) drug therapy: Secondary | ICD-10-CM | POA: Diagnosis not present

## 2018-02-04 DIAGNOSIS — K219 Gastro-esophageal reflux disease without esophagitis: Secondary | ICD-10-CM | POA: Diagnosis not present

## 2018-02-04 DIAGNOSIS — D176 Benign lipomatous neoplasm of spermatic cord: Secondary | ICD-10-CM | POA: Diagnosis not present

## 2018-02-28 DIAGNOSIS — E7849 Other hyperlipidemia: Secondary | ICD-10-CM | POA: Diagnosis not present

## 2018-02-28 DIAGNOSIS — Z125 Encounter for screening for malignant neoplasm of prostate: Secondary | ICD-10-CM | POA: Diagnosis not present

## 2018-02-28 DIAGNOSIS — I10 Essential (primary) hypertension: Secondary | ICD-10-CM | POA: Diagnosis not present

## 2018-02-28 DIAGNOSIS — E119 Type 2 diabetes mellitus without complications: Secondary | ICD-10-CM | POA: Diagnosis not present

## 2018-02-28 DIAGNOSIS — Z6829 Body mass index (BMI) 29.0-29.9, adult: Secondary | ICD-10-CM | POA: Diagnosis not present

## 2018-03-31 ENCOUNTER — Ambulatory Visit (INDEPENDENT_AMBULATORY_CARE_PROVIDER_SITE_OTHER): Payer: Self-pay

## 2018-03-31 DIAGNOSIS — Z86010 Personal history of colonic polyps: Secondary | ICD-10-CM

## 2018-03-31 MED ORDER — PEG 3350-KCL-NA BICARB-NACL 420 G PO SOLR: 4000.0000 mL | ORAL | 0 refills | Status: DC

## 2018-03-31 NOTE — Patient Instructions (Signed)
William Harvey   January 12, 1956 MRN: [id number]    Procedure Date: 05/31/18 Time to register: 9:30am Place to register: [name] Procedure Time: 10:30am Scheduled provider: R. Garfield Cornea, MD  PREPARATION FOR COLONOSCOPY WITH TRI-LYTE SPLIT PREP  Please notify us immediately if you are diabetic, take iron supplements, or if you are on Coumadin or any other blood thinners.   Please hold the following medications: I will mail a letter with this information  You will need to purchase 1 fleet enema and 1 box of Bisacodyl '5mg'$  tablets.   2 DAYS BEFORE PROCEDURE:  DATE: 05/29/18   DAY: Sunday Begin clear liquid diet AFTER your lunch meal. NO SOLID FOODS after this point.  1 DAY BEFORE PROCEDURE:  DATE: 05/30/18   DAY: Monday Continue clear liquids the entire day - NO SOLID FOOD.   Diabetic medications adjustments for today: see letter  At 2:00 pm:  Take 2 Bisacodyl tablets.   At 4:00pm:  Start drinking your solution. Make sure you mix well per instructions on the bottle. Try to drink 1 (one) 8 ounce glass every 10-15 minutes until you have consumed HALF the jug. You should complete by 6:00pm.You must keep the left over solution refrigerated until completed next day.  Continue clear liquids. You must drink plenty of clear liquids to prevent dehyration and kidney failure. Nothing to eat or drink after midnight.  EXCEPTION: If you take medications for your heart, blood pressure or breathing, you may take these medications with a small amount of clear liquid.    DAY OF PROCEDURE:   DATE: 05/31/18   DAY: Tuesday  Diabetic medications adjustments for today: see letter  Five hours before your procedure time @ 5:30am:  Finish remaining amout of bowel prep, drinking 1 (one) 8 ounce glass every 10-15 minutes until complete. You have two hours to consume remaining prep.   Three hours before your procedure time '@7'$ :30am:  Nothing by mouth.   At least one hour before going to the  hospital:  Give yourself one Fleet enema. You may take your morning medications with sip of water unless we have instructed otherwise.      Please see below for Dietary Information.  CLEAR LIQUIDS INCLUDE:  Water Jello (NOT red in color)   Ice Popsicles (NOT red in color)   Tea (sugar ok, no milk/cream) Powdered fruit flavored drinks  Coffee (sugar ok, no milk/cream) Gatorade/ Lemonade/ Kool-Aid  (NOT red in color)   Juice: apple, white grape, white cranberry Soft drinks  Clear bullion, consomme, broth (fat free beef/chicken/vegetable)  Carbonated beverages (any kind)  Strained chicken noodle soup Hard Candy   Remember: Clear liquids are liquids that will allow you to see your fingers on the other side of a clear glass. Be sure liquids are NOT red in color, and not cloudy, but CLEAR.  DO NOT EAT OR DRINK ANY OF THE FOLLOWING:  Dairy products of any kind   Cranberry juice Tomato juice / V8 juice   Grapefruit juice Orange juice     Red grape juice  Do not eat any solid foods, including such foods as: cereal, oatmeal, yogurt, fruits, vegetables, creamed soups, eggs, bread, crackers, pureed foods in a blender, etc.   HELPFUL HINTS FOR DRINKING PREP SOLUTION:   Make sure prep is extremely cold. Mix and refrigerate the the morning of the prep. You may also put in the freezer.   You may try mixing some Crystal Light or Country Time Lemonade if  you prefer. Mix in small amounts; add more if necessary.  Try drinking through a straw  Rinse mouth with water or a mouthwash between glasses, to remove after-taste.  Try sipping on a cold beverage /ice/ popsicles between glasses of prep.  Place a piece of sugar-free hard candy in mouth between glasses.  If you become nauseated, try consuming smaller amounts, or stretch out the time between glasses. Stop for 30-60 minutes, then slowly start back drinking.        OTHER INSTRUCTIONS  You will need a responsible adult at least 60 years  of age to accompany you and drive you home. This person must remain in the waiting room during your procedure. The hospital will cancel your procedure if you do not have a responsible adult with you.   1. Wear loose fitting clothing that is easily removed. 2. Leave jewelry and other valuables at home.  3. Remove all body piercing jewelry and leave at home. 4. Total time from sign-in until discharge is approximately 2-3 hours. 5. You should go home directly after your procedure and rest. You can resume normal activities the day after your procedure. 6. The day of your procedure you should not:  Drive  Make legal decisions  Operate machinery  Drink alcohol  Return to work   You may call the office (Dept: [phone]) before 5:00pm, or page the doctor on call [phone]) after 5:00pm, for further instructions, if necessary.   Insurance Information YOU WILL NEED TO CHECK WITH YOUR INSURANCE COMPANY FOR THE BENEFITS OF COVERAGE YOU HAVE FOR THIS PROCEDURE.  UNFORTUNATELY, NOT ALL INSURANCE COMPANIES HAVE BENEFITS TO COVER ALL OR PART OF THESE TYPES OF PROCEDURES.  IT IS YOUR RESPONSIBILITY TO CHECK YOUR BENEFITS, HOWEVER, WE WILL BE GLAD TO ASSIST YOU WITH ANY CODES YOUR INSURANCE COMPANY MAY NEED.    PLEASE NOTE THAT MOST INSURANCE COMPANIES WILL NOT COVER A SCREENING COLONOSCOPY FOR PEOPLE UNDER THE AGE OF 50  IF YOU HAVE BCBS INSURANCE, YOU MAY HAVE BENEFITS FOR A SCREENING COLONOSCOPY BUT IF POLYPS ARE FOUND THE DIAGNOSIS WILL CHANGE AND THEN YOU MAY HAVE A DEDUCTIBLE THAT WILL NEED TO BE MET. SO PLEASE MAKE SURE YOU CHECK YOUR BENEFITS FOR A SCREENING COLONOSCOPY AS WELL AS A DIAGNOSTIC COLONOSCOPY.

## 2018-03-31 NOTE — Progress Notes (Signed)
Gastroenterology Pre-Procedure Review  Request Date:03/31/18 Requesting Physician: 5 year recall RMR-( last tcs 01/24/13 tubular adenoma)  PATIENT REVIEW QUESTIONS: The patient responded to the following health history questions as indicated:    1. Diabetes Melitis: yes (metformin) 2. Joint replacements in the past 12 months: no 3. Major health problems in the past 3 months: no 4. Has an artificial valve or MVP: no 5. Has a defibrillator: no 6. Has been advised in past to take antibiotics in advance of a procedure like teeth cleaning: no 7. Family history of colon cancer: no  8. Alcohol Use: no 9. History of sleep apnea: no  10. History of coronary artery or other vascular stents placed within the last 12 months: no 11. History of any prior anesthesia complications: no    MEDICATIONS & ALLERGIES:    Patient reports the following regarding taking any blood thinners:   Plavix? no Aspirin? no Coumadin? no Brilinta? no Xarelto? no Eliquis? no Pradaxa? no Savaysa? no Effient? no  Patient confirms/reports the following medications:  Current Outpatient Medications  Medication Sig Dispense Refill  . acetaminophen (TYLENOL) 650 MG CR tablet Take by mouth.    . benazepril (LOTENSIN) 20 MG tablet Take by mouth daily.    . DULoxetine (CYMBALTA) 60 MG capsule daily.  1  . hydrochlorothiazide (MICROZIDE) 12.5 MG capsule Take by mouth daily.    . metFORMIN (GLUMETZA) 1000 MG (MOD) 24 hr tablet Take by mouth 2 (two) times daily.    . naproxen sodium (ANAPROX) 220 MG tablet Take 220 mg by mouth 2 (two) times daily with a meal. Pt takes two Aleve once every morning    . Omega-3 Fatty Acids (FISH OIL) 500 MG CAPS Take by mouth 2 (two) times daily.    Marland Kitchen omeprazole (PRILOSEC) 20 MG capsule Take by mouth daily.    . Turmeric 500 MG CAPS Take by mouth daily.    . vitamin B-12 (CYANOCOBALAMIN) 1000 MCG tablet Take by mouth daily.     No current facility-administered medications for this visit.      Patient confirms/reports the following allergies:  No Known Allergies  No orders of the defined types were placed in this encounter.   AUTHORIZATION INFORMATION Primary Insurance:BCBS [place] ,  ID #: [id number] Pre-Cert / Auth required: no   SCHEDULE INFORMATION: Procedure has been scheduled as follows:  Date: 05/31/18, Time:  10:30 Location: [facility] Dr[facility]  This Gastroenterology Pre-Precedure Review Form is being routed to the following provider(s): Roseanne Kaufman NP

## 2018-04-04 NOTE — Progress Notes (Signed)
Reviewed prior procedure notes. Was not on Cymbalta at that time. Conscious sedation in 2014. No other medications that would raise concern to lend towards Propofol. No ETOH use. Can pursue with conscious sedation. Hold metformin day of procedure.

## 2018-04-04 NOTE — Progress Notes (Signed)
Letter mailed to the pt. 

## 2018-05-31 ENCOUNTER — Other Ambulatory Visit: Payer: Self-pay

## 2018-05-31 ENCOUNTER — Encounter (HOSPITAL_COMMUNITY): Payer: Self-pay | Admitting: *Deleted

## 2018-05-31 ENCOUNTER — Ambulatory Visit (HOSPITAL_COMMUNITY)
Admission: RE | Admit: 2018-05-31 | Discharge: 2018-05-31 | Disposition: A | Discharge status: Home / Self Care | Payer: BLUE CROSS/BLUE SHIELD | Source: Ambulatory Visit | Attending: Internal Medicine | Admitting: Internal Medicine

## 2018-05-31 ENCOUNTER — Encounter (HOSPITAL_COMMUNITY)
Admission: RE | Disposition: A | Discharge status: Home / Self Care | Payer: Self-pay | Source: Ambulatory Visit | Attending: Internal Medicine

## 2018-05-31 DIAGNOSIS — E119 Type 2 diabetes mellitus without complications: Secondary | ICD-10-CM | POA: Insufficient documentation

## 2018-05-31 DIAGNOSIS — Z87891 Personal history of nicotine dependence: Secondary | ICD-10-CM | POA: Insufficient documentation

## 2018-05-31 DIAGNOSIS — Z86010 Personal history of colonic polyps: Secondary | ICD-10-CM | POA: Diagnosis not present

## 2018-05-31 DIAGNOSIS — K219 Gastro-esophageal reflux disease without esophagitis: Secondary | ICD-10-CM | POA: Insufficient documentation

## 2018-05-31 DIAGNOSIS — I10 Essential (primary) hypertension: Secondary | ICD-10-CM | POA: Insufficient documentation

## 2018-05-31 DIAGNOSIS — Z7984 Long term (current) use of oral hypoglycemic drugs: Secondary | ICD-10-CM | POA: Insufficient documentation

## 2018-05-31 DIAGNOSIS — Z1211 Encounter for screening for malignant neoplasm of colon: Secondary | ICD-10-CM | POA: Diagnosis not present

## 2018-05-31 DIAGNOSIS — Z79899 Other long term (current) drug therapy: Secondary | ICD-10-CM | POA: Insufficient documentation

## 2018-05-31 HISTORY — DX: Type 2 diabetes mellitus without complications: E11.9

## 2018-05-31 HISTORY — PX: COLONOSCOPY: SHX5424

## 2018-05-31 LAB — GLUCOSE, CAPILLARY: Glucose-Capillary: 190 mg/dL — ABNORMAL HIGH (ref 70–99)

## 2018-05-31 SURGERY — COLONOSCOPY
Anesthesia: Moderate Sedation

## 2018-05-31 MED ORDER — MEPERIDINE HCL 100 MG/ML IJ SOLN
INTRAMUSCULAR | Status: DC | PRN
  Administered 2018-05-31 (×2): 50 mg via INTRAVENOUS
  Administered 2018-05-31: 25 mg via INTRAVENOUS

## 2018-05-31 MED ORDER — MIDAZOLAM HCL 5 MG/5ML IJ SOLN
INTRAMUSCULAR | Status: DC | PRN
  Administered 2018-05-31: 2 mg via INTRAVENOUS
  Administered 2018-05-31: 1 mg via INTRAVENOUS
  Administered 2018-05-31: 2 mg via INTRAVENOUS

## 2018-05-31 MED ORDER — ONDANSETRON HCL 4 MG/2ML IJ SOLN
INTRAMUSCULAR | Status: AC
  Filled 2018-05-31: qty 2

## 2018-05-31 MED ORDER — MEPERIDINE HCL 100 MG/ML IJ SOLN
INTRAMUSCULAR | Status: AC
  Filled 2018-05-31: qty 2

## 2018-05-31 MED ORDER — MIDAZOLAM HCL 5 MG/5ML IJ SOLN
INTRAMUSCULAR | Status: AC
  Filled 2018-05-31: qty 10

## 2018-05-31 MED ORDER — SIMETHICONE 40 MG/0.6ML PO SUSP
ORAL | Status: DC | PRN
  Administered 2018-05-31: 10:00:00

## 2018-05-31 MED ORDER — ONDANSETRON HCL 4 MG/2ML IJ SOLN
INTRAMUSCULAR | Status: DC | PRN
  Administered 2018-05-31: 4 mg via INTRAVENOUS

## 2018-05-31 MED ORDER — SODIUM CHLORIDE 0.9 % IV SOLN
INTRAVENOUS | Status: DC
  Administered 2018-05-31: 10:00:00 via INTRAVENOUS

## 2018-05-31 NOTE — Discharge Instructions (Signed)
°  Colonoscopy Discharge Instructions  Read the instructions outlined below and refer to this sheet in the next few weeks. These discharge instructions provide you with general information on caring for yourself after you leave the hospital. Your doctor may also give you specific instructions. While your treatment has been planned according to the most current medical practices available, unavoidable complications occasionally occur. If you have any problems or questions after discharge, call Dr. Gala Romney at [phone]. ACTIVITY  You may resume your regular activity, but move at a slower pace for the next 24 hours.   Take frequent rest periods for the next 24 hours.   Walking will help get rid of the air and reduce the bloated feeling in your belly (abdomen).   No driving for 24 hours (because of the medicine (anesthesia) used during the test).    Do not sign any important legal documents or operate any machinery for 24 hours (because of the anesthesia used during the test).  NUTRITION  Drink plenty of fluids.   You may resume your normal diet as instructed by your doctor.   Begin with a light meal and progress to your normal diet. Heavy or fried foods are harder to digest and may make you feel sick to your stomach (nauseated).   Avoid alcoholic beverages for 24 hours or as instructed.  MEDICATIONS  You may resume your normal medications unless your doctor tells you otherwise.  WHAT YOU CAN EXPECT TODAY  Some feelings of bloating in the abdomen.   Passage of more gas than usual.   Spotting of blood in your stool or on the toilet paper.  IF YOU HAD POLYPS REMOVED DURING THE COLONOSCOPY:  No aspirin products for 7 days or as instructed.   No alcohol for 7 days or as instructed.   Eat a soft diet for the next 24 hours.  FINDING OUT THE RESULTS OF YOUR TEST Not all test results are available during your visit. If your test results are not back during the visit, make an appointment  with your caregiver to find out the results. Do not assume everything is normal if you have not heard from your caregiver or the medical facility. It is important for you to follow up on all of your test results.  SEEK IMMEDIATE MEDICAL ATTENTION IF:  You have more than a spotting of blood in your stool.   Your belly is swollen (abdominal distention).   You are nauseated or vomiting.   You have a temperature over 101.   You have abdominal pain or discomfort that is severe or gets worse throughout the day.    Repeat colonoscopy in 5 years  Resume blood pressure medication when you get home.
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
## 2018-05-31 NOTE — Op Note (Signed)
[facility] Patient Name: William Harvey Procedure Date: 05/31/2018 9:20 AM MRN: [id number] Date of Birth: 1955/12/23 Attending MD: Norvel Richards , MD CSN: [id number] Age: 62 Admit Type: Outpatient Procedure:                Colonoscopy Indications:              High risk colon cancer surveillance: Personal                            history of colonic polyps Providers:                Norvel Richards, MD, Jeanann Lewandowsky. Gwenlyn Perking RN, RN,                            Aram Candela Referring MD:              Medicines:                Midazolam 5 mg IV, Meperidine 993 mg IV Complications:            No immediate complications. Estimated Blood Loss:     Estimated blood loss: none. Procedure:                Pre-Anesthesia Assessment:                           - Prior to the procedure, a History and Physical                            was performed, and patient medications and                            allergies were reviewed. The patient's tolerance of                            previous anesthesia was also reviewed. The risks                            and benefits of the procedure and the sedation                            options and risks were discussed with the patient.                            All questions were answered, and informed consent                            was obtained. Prior Anticoagulants: The patient has                            taken no previous anticoagulant or antiplatelet                            agents. ASA Grade Assessment: II - A patient with  mild systemic disease. After reviewing the risks                            and benefits, the patient was deemed in                            satisfactory condition to undergo the procedure.                           After obtaining informed consent, the colonoscope                            was passed under direct vision. Throughout the                            procedure, the  patient's blood pressure, pulse, and                            oxygen saturations were monitored continuously. The                            EC-[id number] ([id number]) scope was introduced through                            the anus and advanced to the the cecum, identified                            by appendiceal orifice and ileocecal valve. The                            colonoscopy was performed without difficulty. The                            patient tolerated the procedure well. The quality                            of the bowel preparation was adequate. The                            ileocecal valve, appendiceal orifice, and rectum                            were photographed. The entire colon was well                            visualized. The quality of the bowel preparation                            was adequate. Scope In: 9:49:00 AM Scope Out: 10:04:28 AM Scope Withdrawal Time: 0 hours 6 minutes 42 seconds  Total Procedure Duration: 0 hours 15 minutes 28 seconds  Findings:      The perianal and digital rectal examinations were normal.      The colon (entire examined portion) appeared normal.  The retroflexed view of the distal rectum and anal verge was normal and       showed no anal or rectal abnormalities. Impression:               - The entire examined colon is normal.                           - The distal rectum and anal verge are normal on                            retroflexion view.                           - No specimens collected. Moderate Sedation:      Moderate (conscious) sedation was administered by the endoscopy nurse       and supervised by the endoscopist. The following parameters were       monitored: oxygen saturation, heart rate, blood pressure, respiratory       rate, EKG, adequacy of pulmonary ventilation, and response to care.       Total physician intraservice time was 20 minutes. Recommendation:           - Patient has a contact number available  for                            emergencies. The signs and symptoms of potential                            delayed complications were discussed with the                            patient. Return to normal activities tomorrow.                            Written discharge instructions were provided to the                            patient.                           - Resume previous diet.                           - Continue present medications.                           - Repeat colonoscopy in 5 years for surveillance.                            blood pressure was up today. Patient did not take                            medication. He is to resume taking blood pressure                            medication when he gets home.                           -  Return to GI office (date not yet determined). Procedure Code(s):        --- Professional ---                           [phone], Colonoscopy, flexible; diagnostic, including                            collection of specimen(s) by brushing or washing,                            when performed (separate procedure)                           G0500, Moderate sedation services provided by the                            same physician or other qualified health care                            professional performing a gastrointestinal                            endoscopic service that sedation supports,                            requiring the presence of an independent trained                            observer to assist in the monitoring of the                            patient's level of consciousness and physiological                            status; initial 15 minutes of intra-service time;                            patient age 58 years or older (additional time may                            be reported with [phone], as appropriate) Diagnosis Code(s):        --- Professional ---                           Z86.010, Personal history of colonic  polyps CPT copyright 2017 American Medical [employer]. All rights reserved. The codes documented in this report are preliminary and upon coder review may  be revised to meet current compliance requirements. Cristopher Estimable. Yaminah Clayborn, MD Norvel Richards, MD 05/31/2018 10:12:01 AM This report has been signed electronically. Number of Addenda: 0

## 2018-06-02 ENCOUNTER — Encounter (HOSPITAL_COMMUNITY): Payer: Self-pay | Admitting: Internal Medicine

## 2018-10-17 DIAGNOSIS — Z0000 Encounter for general adult medical examination without abnormal findings: Secondary | ICD-10-CM | POA: Diagnosis not present

## 2018-10-17 DIAGNOSIS — Z6829 Body mass index (BMI) 29.0-29.9, adult: Secondary | ICD-10-CM | POA: Diagnosis not present

## 2019-04-17 DIAGNOSIS — I10 Essential (primary) hypertension: Secondary | ICD-10-CM | POA: Diagnosis not present

## 2019-04-17 DIAGNOSIS — E785 Hyperlipidemia, unspecified: Secondary | ICD-10-CM | POA: Diagnosis not present

## 2019-04-17 DIAGNOSIS — Z6828 Body mass index (BMI) 28.0-28.9, adult: Secondary | ICD-10-CM | POA: Diagnosis not present

## 2019-04-17 DIAGNOSIS — E119 Type 2 diabetes mellitus without complications: Secondary | ICD-10-CM | POA: Diagnosis not present

## 2019-10-11 DIAGNOSIS — Z6828 Body mass index (BMI) 28.0-28.9, adult: Secondary | ICD-10-CM | POA: Diagnosis not present

## 2019-10-11 DIAGNOSIS — M7550 Bursitis of unspecified shoulder: Secondary | ICD-10-CM | POA: Diagnosis not present

## 2019-10-18 DIAGNOSIS — I10 Essential (primary) hypertension: Secondary | ICD-10-CM | POA: Diagnosis not present

## 2019-10-18 DIAGNOSIS — Z125 Encounter for screening for malignant neoplasm of prostate: Secondary | ICD-10-CM | POA: Diagnosis not present

## 2019-10-18 DIAGNOSIS — M7501 Adhesive capsulitis of right shoulder: Secondary | ICD-10-CM | POA: Diagnosis not present

## 2019-10-18 DIAGNOSIS — E7849 Other hyperlipidemia: Secondary | ICD-10-CM | POA: Diagnosis not present

## 2019-10-18 DIAGNOSIS — E119 Type 2 diabetes mellitus without complications: Secondary | ICD-10-CM | POA: Diagnosis not present

## 2019-11-01 DIAGNOSIS — M7541 Impingement syndrome of right shoulder: Secondary | ICD-10-CM | POA: Diagnosis not present

## 2019-11-01 DIAGNOSIS — M25511 Pain in right shoulder: Secondary | ICD-10-CM | POA: Diagnosis not present

## 2019-11-01 DIAGNOSIS — M19011 Primary osteoarthritis, right shoulder: Secondary | ICD-10-CM | POA: Diagnosis not present

## 2019-11-01 DIAGNOSIS — M7581 Other shoulder lesions, right shoulder: Secondary | ICD-10-CM | POA: Diagnosis not present

## 2019-11-24 DIAGNOSIS — M79675 Pain in left toe(s): Secondary | ICD-10-CM | POA: Diagnosis not present

## 2019-11-24 DIAGNOSIS — M2042 Other hammer toe(s) (acquired), left foot: Secondary | ICD-10-CM | POA: Diagnosis not present

## 2020-04-17 ENCOUNTER — Ambulatory Visit (INDEPENDENT_AMBULATORY_CARE_PROVIDER_SITE_OTHER): Payer: 59 | Admitting: Urology

## 2020-04-17 ENCOUNTER — Other Ambulatory Visit: Payer: Self-pay

## 2020-04-17 ENCOUNTER — Encounter: Payer: Self-pay | Admitting: Urology

## 2020-04-17 VITALS — BP 127/67 | HR 75 | Temp 97.5°F | Ht 73.0 in | Wt 200.0 lb

## 2020-04-17 DIAGNOSIS — N471 Phimosis: Secondary | ICD-10-CM | POA: Insufficient documentation

## 2020-04-17 DIAGNOSIS — N481 Balanitis: Secondary | ICD-10-CM
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
## 2020-04-17 NOTE — Progress Notes (Signed)
Urological Symptom Review  Patient is experiencing the following symptoms: Frequent urination Hard to postpone urination Get up at night to urinate Stream starts and stops Blood in urine Kidney Stones Yeast infections due to diabetes Need to be circumcised  Review of Systems  Gastrointestinal (upper)  : Indigestion/heartburn  Gastrointestinal (lower) : Negative for lower GI symptoms  Constitutional : Negative for symptoms  Skin: Negative for skin symptoms  Eyes: Blurred vision  Ear/Nose/Throat : Negative for Ear/Nose/Throat symptoms  Hematologic/Lymphatic: Negative for Hematologic/Lymphatic symptoms  Cardiovascular : Negative for cardiovascular symptoms  Respiratory : Negative for respiratory symptoms  Endocrine: Negative for endocrine symptoms  Musculoskeletal: Back pain Joint pain  Neurological: Negative for neurological symptoms  Psychologic: Negative for psychiatric symptoms

## 2020-04-17 NOTE — Patient Instructions (Signed)
Balanitis  Balanitis is swelling and irritation (inflammation) of the head of the penis (glans penis). The condition may also cause inflammation of the skin around the glans penis (foreskin) in men who have not been circumcised. It may develop because of an infection or another medical condition. Balanitis occurs most often among men who have not had their foreskin removed (uncircumcised men). Balanitis sometimes causes scarring of the penis or foreskin, which can require surgery. Untreated balanitis can increase the risk of penile cancer. What are the causes? Common causes of this condition include:  Poor personal hygiene, especially in uncircumcised men. Not cleaning the glans penis and foreskin well can result in buildup of bacteria, viruses, and yeast, which can lead to infection and inflammation.  Irritation and lack of air flow due to fluid (smegma) that can build up on the glans penis. Other causes include:  Chemical irritation from products such as soaps or shower gels (especially those that have fragrance), condoms, personal lubricants, petroleum jelly, spermicides, or fabric softeners.  Skin conditions, such as eczema, dermatitis, and psoriasis.  Allergies to medicines, such as tetracycline and sulfa drugs.  Certain medical conditions, including liver cirrhosis, congestive heart failure, diabetes, and kidney disease.  Infections, such as candidiasis, HPV (human papillomavirus), herpes simplex, gonorrhea, and syphilis.  Severe obesity. What increases the risk? The following factors may make you more likely to develop this condition:  Having diabetes. This is the most common risk factor.  Having a tight foreskin that is difficult to pull back (retract) past the glans.  Having sexual intercourse without using a condom. What are the signs or symptoms? Symptoms of this condition include:  Discharge from under the foreskin.  A bad smell.  Pain or difficulty retracting the  foreskin.  Tenderness, redness, and swelling of the glans.  A rash or sores on the glans or foreskin.  Itchiness.  Inability to get an erection due to pain.  Difficulty urinating.  Scarring of the penis or foreskin, in some cases. How is this diagnosed? This condition may be diagnosed based on:  A physical exam.  Testing a swab of discharge to check for bacterial or fungal infection.  Blood tests: ? To check for viruses that can cause balanitis. ? To check your blood sugar (glucose) level. High blood glucose could be a sign of diabetes, which can cause balanitis. How is this treated? Treatment for balanitis depends on the cause. Treatment may include:  Improving personal hygiene. Your health care provider may recommend sitting in a bath of warm water that is deep enough to cover your hips and buttocks (sitz bath).  Medicines such as: ? Creams or ointments to reduce swelling (steroids) or to treat an infection. ? Antibiotic medicine. ? Antifungal medicine.  Surgery to remove or cut the foreskin (circumcision). This may be done if you have scarring on the foreskin that makes it difficult to retract.  Controlling other medical problems that may be causing your condition or making it worse. Follow these instructions at home:  Do not have sex until the condition clears up, or until your health care provider approves.  Keep your penis clean and dry. Take sitz baths as recommended by your health care provider.  Avoid products that irritate your skin or make symptoms worse, such as soaps and shower gels that have fragrance.  Take over-the-counter and prescription medicines only as told by your health care provider. ? If you were prescribed an antibiotic medicine or a cream or ointment, use it as   told by your health care provider. Do not stop using your medicine, cream, or ointment even if you start to feel better. ? Do not drive or use heavy machinery while taking prescription  pain medicine. Contact a health care provider if:  Your symptoms get worse or do not improve with home care.  You develop chills or a fever.  You have trouble urinating.  You cannot retract your foreskin. Get help right away if:  You develop severe pain.  You are unable to urinate. Summary  Balanitis is inflammation of the head of the penis (glans penis) caused by irritation or infection.  Balanitis causes pain, redness, and swelling of the glans penis.  This condition is most common among uncircumcised men who do not keep their glans penis clean and in men who have diabetes.  Treatment may include creams or ointments.  Good hygiene is important for prevention. This includes pulling back the foreskin when washing your penis. This information is not intended to replace advice given to you by your health care provider. Make sure you discuss any questions you have with your health care provider. Document Revised: 11/05/2017 Document Reviewed: 10/12/2016 Elsevier Patient Education  2020 [employer]  

## 2020-05-14 ENCOUNTER — Telehealth: Payer: Self-pay | Admitting: Urology

## 2020-05-14 NOTE — Telephone Encounter (Signed)
Pt called and asked when he is scheduled for surgery.

## 2020-05-15 NOTE — Telephone Encounter (Signed)
Looked in pts chart, Called and told him 7/19.

## 2020-06-20 ENCOUNTER — Telehealth: Payer: Self-pay | Admitting: Urology

## 2020-06-20 NOTE — Patient Instructions (Signed)
RAJAH TAGLIAFERRO  06/20/2020     @PREFPERIOPPHARMACY @   Your procedure is scheduled on  06/24/2020.  Report to [name] at  0700  A.M.  Call this number if you have problems the morning of surgery:  [phone]   Remember:  Do not eat or drink after midnight.                         Take these medicines the morning of surgery with A SIP OF WATER  Cymbalta, celebrex, hydrocodone(if needed), robaxin, prilosec.    Do not wear jewelry, make-up or nail polish.  Do not wear lotions, powders, or perfumes. Please wear deodorant and brush your teeth.  Do not shave 48 hours prior to surgery.  Men may shave face and neck.  Do not bring valuables to the hospital.  [facility] is not responsible for any belongings or valuables.  Contacts, dentures or bridgework may not be worn into surgery.  Leave your suitcase in the car.  After surgery it may be brought to your room.  For patients admitted to the hospital, discharge time will be determined by your treatment team.  Patients discharged the day of surgery will not be allowed to drive home.   Name and phone number of your driver:   family Special instructions:  DO NOT smoke the morning of your procedure.  Please read over the following fact sheets that you were given. Anesthesia Post-op Instructions and Care and Recovery After Surgery       Circumcision, Adult, Care After This sheet gives you information about how to care for yourself after your procedure. Your doctor may also give you more specific instructions. If you have problems or questions, contact your doctor. Follow these instructions at home: Cut care   Follow instructions from your doctor about how to take care of your cut from surgery (incision). Make sure you: ? Wash your hands with soap and water before you change your bandage (dressing). If you cannot use soap and water, use hand sanitizer. ? Change your bandage as told by your doctor. ? Leave  stitches (sutures), skin glue, or skin tape (adhesive) strips in place. They may need to stay in place for 2 weeks or longer. If tape strips get loose and curl up, you may trim the loose edges. Do not remove tape strips completely unless your doctor says it is okay  Check your cut area every day for signs of infection. Check for: ? More redness, swelling, or pain. ? More fluid or blood. ? Warmth. ? Pus or a bad smell. Bathing  Do not take baths, swim, or use a hot tub until your doctor says it is okay. Ask your doctor if you can take showers. You may only be allowed to take sponge baths for bathing.  Do not get your cut area wet during the 24 hours after the procedure, or as long as told by your doctor.  When you can shower, do not rub the cut area. Gently pat it dry. General instructions  Avoid heavy lifting, contact sports, biking, or swimming until your doctor says it is okay.  Do not have sex until your doctor says it is okay.  Take or apply over-the-counter and prescription medicines only as told by your doctor.  Keep all follow-up visits as told by your doctor. This is important. Contact a doctor if:  Medicine does not  help your pain.  You have more redness, swelling, or pain around your cut.  You have more fluid or blood coming from your cut.  Your cut feels warm to the touch.  You have pus or a bad smell coming from your cut.  You have a fever. Get help right away if:  You cannot pee (urinate).  It hurts to pee.  Your pain is not helped by medicines.  There is redness, swelling, and soreness that spreads up the shaft of your penis, your thighs, or your lower belly (abdomen).  You have bleeding that does not stop when you press on it. Summary  Follow instructions from your doctor about how to take care of your cut from surgery (incision).  Check your cut area every day for signs of infection.  Do not have sex until your doctor says it is okay. This  information is not intended to replace advice given to you by your health care provider. Make sure you discuss any questions you have with your health care provider. Document Revised: 11/05/2017 Document Reviewed: 12/01/2016 Elsevier Patient Education  2020 [place] Anesthesia, Adult, Care After This sheet gives you information about how to care for yourself after your procedure. Your health care provider may also give you more specific instructions. If you have problems or questions, contact your health care provider. What can I expect after the procedure? After the procedure, the following side effects are common:  Pain or discomfort at the IV site.  Nausea.  Vomiting.  Sore throat.  Trouble concentrating.  Feeling cold or chills.  Weak or tired.  Sleepiness and fatigue.  Soreness and body aches. These side effects can affect parts of the body that were not involved in surgery. Follow these instructions at home:  For at least 24 hours after the procedure:  Have a responsible adult stay with you. It is important to have someone help care for you until you are awake and alert.  Rest as needed.  Do not: ? Participate in activities in which you could fall or become injured. ? Drive. ? Use heavy machinery. ? Drink alcohol. ? Take sleeping pills or medicines that cause drowsiness. ? Make important decisions or sign legal documents. ? Take care of children on your own. Eating and drinking  Follow any instructions from your health care provider about eating or drinking restrictions.  When you feel hungry, start by eating small amounts of foods that are soft and easy to digest (bland), such as toast. Gradually return to your regular diet.  Drink enough fluid to keep your urine pale yellow.  If you vomit, rehydrate by drinking water, juice, or clear broth. General instructions  If you have sleep apnea, surgery and certain medicines can increase your risk  for breathing problems. Follow instructions from your health care provider about wearing your sleep device: ? Anytime you are sleeping, including during daytime naps. ? While taking prescription pain medicines, sleeping medicines, or medicines that make you drowsy.  Return to your normal activities as told by your health care provider. Ask your health care provider what activities are safe for you.  Take over-the-counter and prescription medicines only as told by your health care provider.  If you smoke, do not smoke without supervision.  Keep all follow-up visits as told by your health care provider. This is important. Contact a health care provider if:  You have nausea or vomiting that does not get better with medicine.  You cannot eat  or drink without vomiting.  You have pain that does not get better with medicine.  You are unable to pass urine.  You develop a skin rash.  You have a fever.  You have redness around your IV site that gets worse. Get help right away if:  You have difficulty breathing.  You have chest pain.  You have blood in your urine or stool, or you vomit blood. Summary  After the procedure, it is common to have a sore throat or nausea. It is also common to feel tired.  Have a responsible adult stay with you for the first 24 hours after general anesthesia. It is important to have someone help care for you until you are awake and alert.  When you feel hungry, start by eating small amounts of foods that are soft and easy to digest (bland), such as toast. Gradually return to your regular diet.  Drink enough fluid to keep your urine pale yellow.  Return to your normal activities as told by your health care provider. Ask your health care provider what activities are safe for you. This information is not intended to replace advice given to you by your health care provider. Make sure you discuss any questions you have with your health care provider. Document  Revised: 11/26/2017 Document Reviewed: 07/09/2017 Elsevier Patient Education  [place]. How to Use Chlorhexidine for Bathing Chlorhexidine gluconate (CHG) is a germ-killing (antiseptic) solution that is used to clean the skin. It can get rid of the bacteria that normally live on the skin and can keep them away for about 24 hours. To clean your skin with CHG, you may be given:  A CHG solution to use in the shower or as part of a sponge bath.  A prepackaged cloth that contains CHG. Cleaning your skin with CHG may help lower the risk for infection:  While you are staying in the intensive care unit of the hospital.  If you have a vascular access, such as a central line, to provide short-term or long-term access to your veins.  If you have a catheter to drain urine from your bladder.  If you are on a ventilator. A ventilator is a machine that helps you breathe by moving air in and out of your lungs.  After surgery. What are the risks? Risks of using CHG include:  A skin reaction.  Hearing loss, if CHG gets in your ears.  Eye injury, if CHG gets in your eyes and is not rinsed out.  The CHG product catching fire. Make sure that you avoid smoking and flames after applying CHG to your skin. Do not use CHG:  If you have a chlorhexidine allergy or have previously reacted to chlorhexidine.  On babies younger than 58 months of age. How to use CHG solution  Use CHG only as told by your health care provider, and follow the instructions on the label.  Use the full amount of CHG as directed. Usually, this is one bottle. During a shower Follow these steps when using CHG solution during a shower (unless your health care provider gives you different instructions): 1. Start the shower. 2. Use your normal soap and shampoo to wash your face and hair. 3. Turn off the shower or move out of the shower stream. 4. Pour the CHG onto a clean washcloth. Do not use any type of brush or  rough-edged sponge. 5. Starting at your neck, lather your body down to your toes. Make sure you follow these  instructions: ? If you will be having surgery, pay special attention to the part of your body where you will be having surgery. Scrub this area for at least 1 minute. ? Do not use CHG on your head or face. If the solution gets into your ears or eyes, rinse them well with water. ? Avoid your genital area. ? Avoid any areas of skin that have broken skin, cuts, or scrapes. ? Scrub your back and under your arms. Make sure to wash skin folds. 6. Let the lather sit on your skin for 1-2 minutes or as long as told by your health care provider. 7. Thoroughly rinse your entire body in the shower. Make sure that all body creases and crevices are rinsed well. 8. Dry off with a clean towel. Do not put any substances on your body afterward--such as powder, lotion, or perfume--unless you are told to do so by your health care provider. Only use lotions that are recommended by the manufacturer. 9. Put on clean clothes or pajamas. 10. If it is the night before your surgery, sleep in clean sheets.  During a sponge bath Follow these steps when using CHG solution during a sponge bath (unless your health care provider gives you different instructions): 1. Use your normal soap and shampoo to wash your face and hair. 2. Pour the CHG onto a clean washcloth. 3. Starting at your neck, lather your body down to your toes. Make sure you follow these instructions: ? If you will be having surgery, pay special attention to the part of your body where you will be having surgery. Scrub this area for at least 1 minute. ? Do not use CHG on your head or face. If the solution gets into your ears or eyes, rinse them well with water. ? Avoid your genital area. ? Avoid any areas of skin that have broken skin, cuts, or scrapes. ? Scrub your back and under your arms. Make sure to wash skin folds. 4. Let the lather sit on your  skin for 1-2 minutes or as long as told by your health care provider. 5. Using a different clean, wet washcloth, thoroughly rinse your entire body. Make sure that all body creases and crevices are rinsed well. 6. Dry off with a clean towel. Do not put any substances on your body afterward--such as powder, lotion, or perfume--unless you are told to do so by your health care provider. Only use lotions that are recommended by the manufacturer. 7. Put on clean clothes or pajamas. 8. If it is the night before your surgery, sleep in clean sheets. How to use CHG prepackaged cloths  Only use CHG cloths as told by your health care provider, and follow the instructions on the label.  Use the CHG cloth on clean, dry skin.  Do not use the CHG cloth on your head or face unless your health care provider tells you to.  When washing with the CHG cloth: ? Avoid your genital area. ? Avoid any areas of skin that have broken skin, cuts, or scrapes. Before surgery Follow these steps when using a CHG cloth to clean before surgery (unless your health care provider gives you different instructions): 1. Using the CHG cloth, vigorously scrub the part of your body where you will be having surgery. Scrub using a back-and-forth motion for 3 minutes. The area on your body should be completely wet with CHG when you are done scrubbing. 2. Do not rinse. Discard the cloth and let the area  air-dry. Do not put any substances on the area afterward, such as powder, lotion, or perfume. 3. Put on clean clothes or pajamas. 4. If it is the night before your surgery, sleep in clean sheets.  For general bathing Follow these steps when using CHG cloths for general bathing (unless your health care provider gives you different instructions). 1. Use a separate CHG cloth for each area of your body. Make sure you wash between any folds of skin and between your fingers and toes. Wash your body in the following order, switching to a new cloth  after each step: ? The front of your neck, shoulders, and chest. ? Both of your arms, under your arms, and your hands. ? Your stomach and groin area, avoiding the genitals. ? Your right leg and foot. ? Your left leg and foot. ? The back of your neck, your back, and your buttocks. 2. Do not rinse. Discard the cloth and let the area air-dry. Do not put any substances on your body afterward--such as powder, lotion, or perfume--unless you are told to do so by your health care provider. Only use lotions that are recommended by the manufacturer. 3. Put on clean clothes or pajamas. Contact a health care provider if:  Your skin gets irritated after scrubbing.  You have questions about using your solution or cloth. Get help right away if:  Your eyes become very red or swollen.  Your eyes itch badly.  Your skin itches badly and is red or swollen.  Your hearing changes.  You have trouble seeing.  You have swelling or tingling in your mouth or throat.  You have trouble breathing.  You swallow any chlorhexidine. Summary  Chlorhexidine gluconate (CHG) is a germ-killing (antiseptic) solution that is used to clean the skin. Cleaning your skin with CHG may help to lower your risk for infection.  You may be given CHG to use for bathing. It may be in a bottle or in a prepackaged cloth to use on your skin. Carefully follow your health care provider's instructions and the instructions on the product label.  Do not use CHG if you have a chlorhexidine allergy.  Contact your health care provider if your skin gets irritated after scrubbing. This information is not intended to replace advice given to you by your health care provider. Make sure you discuss any questions you have with your health care provider. Document Revised: 02/09/2019 Document Reviewed: 10/21/2017 Elsevier Patient Education  [place].

## 2020-06-20 NOTE — Telephone Encounter (Signed)
Confirmed with pt surgery date for July 19th

## 2020-06-20 NOTE — Telephone Encounter (Signed)
Pts wife called and wants a call back regarding scheduled procedure. She cant remember the date.

## 2020-06-21 ENCOUNTER — Encounter (HOSPITAL_COMMUNITY)
Admission: RE | Admit: 2020-06-21 | Discharge: 2020-06-21 | Disposition: A | Discharge status: Home / Self Care | Payer: 59 | Source: Ambulatory Visit | Attending: Urology | Admitting: Urology

## 2020-06-21 ENCOUNTER — Telehealth: Payer: Self-pay

## 2020-06-21 ENCOUNTER — Other Ambulatory Visit (HOSPITAL_COMMUNITY): Payer: 59

## 2020-06-21 NOTE — Telephone Encounter (Signed)
Pt called to post pone upcoming surgery for 7/19. Pt recently had shoulder surgery and would like to wait 30 days. Hoyle Sauer with OR scheduling messaged for date change

## 2020-06-21 NOTE — Telephone Encounter (Signed)
Pt wants to res surgery. He just had a surgery last week and his dr recommends waiting 30 days between surgeries.

## 2020-07-01 ENCOUNTER — Ambulatory Visit: Payer: 59 | Admitting: Urology

## 2020-07-02 ENCOUNTER — Encounter (HOSPITAL_COMMUNITY): Payer: Self-pay | Admitting: Specialist

## 2020-07-02 ENCOUNTER — Ambulatory Visit (HOSPITAL_COMMUNITY): Payer: 59 | Attending: Orthopaedic Surgery | Admitting: Specialist

## 2020-07-02 ENCOUNTER — Other Ambulatory Visit: Payer: Self-pay

## 2020-07-02 DIAGNOSIS — M25611 Stiffness of right shoulder, not elsewhere classified: Secondary | ICD-10-CM | POA: Insufficient documentation

## 2020-07-02 DIAGNOSIS — M25511 Pain in right shoulder: Secondary | ICD-10-CM | POA: Diagnosis present

## 2020-07-02 DIAGNOSIS — R29898 Other symptoms and signs involving the musculoskeletal system: Secondary | ICD-10-CM | POA: Diagnosis present
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
## 2020-07-02 NOTE — Patient Instructions (Signed)

## 2020-07-04 ENCOUNTER — Other Ambulatory Visit: Payer: Self-pay

## 2020-07-04 ENCOUNTER — Encounter (HOSPITAL_COMMUNITY): Payer: Self-pay | Admitting: Occupational Therapy

## 2020-07-04 ENCOUNTER — Ambulatory Visit (HOSPITAL_COMMUNITY): Payer: 59 | Admitting: Occupational Therapy

## 2020-07-04 DIAGNOSIS — M25611 Stiffness of right shoulder, not elsewhere classified: Secondary | ICD-10-CM | POA: Diagnosis not present

## 2020-07-04 DIAGNOSIS — M25511 Pain in right shoulder: Secondary | ICD-10-CM

## 2020-07-04 DIAGNOSIS — R29898 Other symptoms and signs involving the musculoskeletal system: Secondary | ICD-10-CM
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
## 2020-07-10 ENCOUNTER — Ambulatory Visit: Payer: 59 | Admitting: Urology

## 2020-07-10 DIAGNOSIS — N481 Balanitis: Secondary | ICD-10-CM

## 2020-07-12 ENCOUNTER — Other Ambulatory Visit: Payer: Self-pay

## 2020-07-12 ENCOUNTER — Ambulatory Visit (HOSPITAL_COMMUNITY): Payer: 59 | Attending: Orthopaedic Surgery | Admitting: Occupational Therapy

## 2020-07-12 DIAGNOSIS — M25511 Pain in right shoulder: Secondary | ICD-10-CM | POA: Diagnosis present

## 2020-07-12 DIAGNOSIS — R29898 Other symptoms and signs involving the musculoskeletal system: Secondary | ICD-10-CM | POA: Insufficient documentation

## 2020-07-12 DIAGNOSIS — M25611 Stiffness of right shoulder, not elsewhere classified: Secondary | ICD-10-CM | POA: Diagnosis not present
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
## 2020-07-16 ENCOUNTER — Other Ambulatory Visit: Payer: Self-pay

## 2020-07-16 ENCOUNTER — Ambulatory Visit (HOSPITAL_COMMUNITY): Payer: 59

## 2020-07-16 ENCOUNTER — Encounter (HOSPITAL_COMMUNITY): Payer: Self-pay

## 2020-07-16 DIAGNOSIS — R29898 Other symptoms and signs involving the musculoskeletal system: Secondary | ICD-10-CM

## 2020-07-16 DIAGNOSIS — M25511 Pain in right shoulder: Secondary | ICD-10-CM

## 2020-07-16 DIAGNOSIS — M25611 Stiffness of right shoulder, not elsewhere classified: Secondary | ICD-10-CM | POA: Diagnosis not present
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
## 2020-07-18 ENCOUNTER — Encounter (HOSPITAL_COMMUNITY): Payer: Self-pay

## 2020-07-18 ENCOUNTER — Ambulatory Visit (HOSPITAL_COMMUNITY): Payer: 59

## 2020-07-18 ENCOUNTER — Other Ambulatory Visit: Payer: Self-pay

## 2020-07-18 DIAGNOSIS — M25611 Stiffness of right shoulder, not elsewhere classified: Secondary | ICD-10-CM

## 2020-07-18 DIAGNOSIS — R29898 Other symptoms and signs involving the musculoskeletal system: Secondary | ICD-10-CM

## 2020-07-18 DIAGNOSIS — M25511 Pain in right shoulder: Secondary | ICD-10-CM
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
## 2020-07-23 ENCOUNTER — Other Ambulatory Visit: Payer: Self-pay

## 2020-07-23 ENCOUNTER — Encounter (HOSPITAL_COMMUNITY): Payer: Self-pay

## 2020-07-23 ENCOUNTER — Ambulatory Visit (HOSPITAL_COMMUNITY): Payer: 59

## 2020-07-23 DIAGNOSIS — R29898 Other symptoms and signs involving the musculoskeletal system: Secondary | ICD-10-CM

## 2020-07-23 DIAGNOSIS — M25511 Pain in right shoulder: Secondary | ICD-10-CM

## 2020-07-23 DIAGNOSIS — M25611 Stiffness of right shoulder, not elsewhere classified: Secondary | ICD-10-CM | POA: Diagnosis not present
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
## 2020-07-25 ENCOUNTER — Other Ambulatory Visit: Payer: Self-pay

## 2020-07-25 ENCOUNTER — Ambulatory Visit (HOSPITAL_COMMUNITY): Payer: 59

## 2020-07-25 DIAGNOSIS — M25611 Stiffness of right shoulder, not elsewhere classified: Secondary | ICD-10-CM | POA: Diagnosis not present

## 2020-07-25 DIAGNOSIS — R29898 Other symptoms and signs involving the musculoskeletal system: Secondary | ICD-10-CM

## 2020-07-25 DIAGNOSIS — M25511 Pain in right shoulder: Secondary | ICD-10-CM
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
## 2020-07-30 ENCOUNTER — Other Ambulatory Visit: Payer: Self-pay

## 2020-07-30 ENCOUNTER — Encounter (HOSPITAL_COMMUNITY): Payer: Self-pay

## 2020-07-30 ENCOUNTER — Ambulatory Visit (HOSPITAL_COMMUNITY): Payer: 59

## 2020-07-30 DIAGNOSIS — M25511 Pain in right shoulder: Secondary | ICD-10-CM

## 2020-07-30 DIAGNOSIS — R29898 Other symptoms and signs involving the musculoskeletal system: Secondary | ICD-10-CM

## 2020-07-30 DIAGNOSIS — M25611 Stiffness of right shoulder, not elsewhere classified: Secondary | ICD-10-CM
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
## 2020-07-30 NOTE — Patient Instructions (Signed)
Perform each exercise ____10-15____ reps. 2-3x days.   1) Protraction   Start by holding a wand or cane at chest height.  Next, slowly push the wand outwards in front of your body so that your elbows become fully straightened. Then, return to the original position.     2) Shoulder FLEXION   In the standing position, hold a wand/cane with both arms, palms down on both sides. Raise up the wand/cane allowing your unaffected arm to perform most of the effort. Your affected arm should be partially relaxed.      3) Internal/External ROTATION   In the standing position, hold a wand/cane with both hands keeping your elbows bent. Move your arms and wand/cane to one side.  Your affected arm should be partially relaxed while your unaffected arm performs most of the effort.                 

## 2020-07-31 NOTE — Patient Instructions (Signed)
William Harvey  07/31/2020     @PREFPERIOPPHARMACY @   Your procedure is scheduled on  08/05/2020.  Report to [name] at  0700  A.M.  Call this number if you have problems the morning of surgery:  [phone]   Remember:  Do not eat or drink after midnight.                        Take these medicines the morning of surgery with A SIP OF WATER  Celebrex, cymbalta, hydrocodone(if needed), prilosec. DO NOT take any medications for diabetes the morning of your procedure.    Do not wear jewelry, make-up or nail polish.  Do not wear lotions, powders, or perfumes. Please wear deodorant and brush your teeth.  Do not shave 48 hours prior to surgery.  Men may shave face and neck.  Do not bring valuables to the hospital.  [facility] is not responsible for any belongings or valuables.  Contacts, dentures or bridgework may not be worn into surgery.  Leave your suitcase in the car.  After surgery it may be brought to your room.  For patients admitted to the hospital, discharge time will be determined by your treatment team.  Patients discharged the day of surgery will not be allowed to drive home.   Name and phone number of your driver:   family Special instructions:  DO NOT smoke the morning of your procedure.  Please read over the following fact sheets that you were given. Anesthesia Post-op Instructions and Care and Recovery After Surgery       Circumcision, Adult, Care After This sheet gives you information about how to care for yourself after your procedure. Your doctor may also give you more specific instructions. If you have problems or questions, contact your doctor. Follow these instructions at home: Cut care   Follow instructions from your doctor about how to take care of your cut from surgery (incision). Make sure you: ? Wash your hands with soap and water before you change your bandage (dressing). If you cannot use soap and water, use hand  sanitizer. ? Change your bandage as told by your doctor. ? Leave stitches (sutures), skin glue, or skin tape (adhesive) strips in place. They may need to stay in place for 2 weeks or longer. If tape strips get loose and curl up, you may trim the loose edges. Do not remove tape strips completely unless your doctor says it is okay  Check your cut area every day for signs of infection. Check for: ? More redness, swelling, or pain. ? More fluid or blood. ? Warmth. ? Pus or a bad smell. Bathing  Do not take baths, swim, or use a hot tub until your doctor says it is okay. Ask your doctor if you can take showers. You may only be allowed to take sponge baths for bathing.  Do not get your cut area wet during the 24 hours after the procedure, or as long as told by your doctor.  When you can shower, do not rub the cut area. Gently pat it dry. General instructions  Avoid heavy lifting, contact sports, biking, or swimming until your doctor says it is okay.  Do not have sex until your doctor says it is okay.  Take or apply over-the-counter and prescription medicines only as told by your doctor.  Keep all follow-up visits as told by your doctor. This is important. Contact  a doctor if:  Medicine does not help your pain.  You have more redness, swelling, or pain around your cut.  You have more fluid or blood coming from your cut.  Your cut feels warm to the touch.  You have pus or a bad smell coming from your cut.  You have a fever. Get help right away if:  You cannot pee (urinate).  It hurts to pee.  Your pain is not helped by medicines.  There is redness, swelling, and soreness that spreads up the shaft of your penis, your thighs, or your lower belly (abdomen).  You have bleeding that does not stop when you press on it. Summary  Follow instructions from your doctor about how to take care of your cut from surgery (incision).  Check your cut area every day for signs of  infection.  Do not have sex until your doctor says it is okay. This information is not intended to replace advice given to you by your health care provider. Make sure you discuss any questions you have with your health care provider. Document Revised: 11/05/2017 Document Reviewed: 12/01/2016 Elsevier Patient Education  2020 [place] Anesthesia, Adult, Care After This sheet gives you information about how to care for yourself after your procedure. Your health care provider may also give you more specific instructions. If you have problems or questions, contact your health care provider. What can I expect after the procedure? After the procedure, the following side effects are common:  Pain or discomfort at the IV site.  Nausea.  Vomiting.  Sore throat.  Trouble concentrating.  Feeling cold or chills.  Weak or tired.  Sleepiness and fatigue.  Soreness and body aches. These side effects can affect parts of the body that were not involved in surgery. Follow these instructions at home:  For at least 24 hours after the procedure:  Have a responsible adult stay with you. It is important to have someone help care for you until you are awake and alert.  Rest as needed.  Do not: ? Participate in activities in which you could fall or become injured. ? Drive. ? Use heavy machinery. ? Drink alcohol. ? Take sleeping pills or medicines that cause drowsiness. ? Make important decisions or sign legal documents. ? Take care of children on your own. Eating and drinking  Follow any instructions from your health care provider about eating or drinking restrictions.  When you feel hungry, start by eating small amounts of foods that are soft and easy to digest (bland), such as toast. Gradually return to your regular diet.  Drink enough fluid to keep your urine pale yellow.  If you vomit, rehydrate by drinking water, juice, or clear broth. General instructions  If you  have sleep apnea, surgery and certain medicines can increase your risk for breathing problems. Follow instructions from your health care provider about wearing your sleep device: ? Anytime you are sleeping, including during daytime naps. ? While taking prescription pain medicines, sleeping medicines, or medicines that make you drowsy.  Return to your normal activities as told by your health care provider. Ask your health care provider what activities are safe for you.  Take over-the-counter and prescription medicines only as told by your health care provider.  If you smoke, do not smoke without supervision.  Keep all follow-up visits as told by your health care provider. This is important. Contact a health care provider if:  You have nausea or vomiting that does not get  better with medicine.  You cannot eat or drink without vomiting.  You have pain that does not get better with medicine.  You are unable to pass urine.  You develop a skin rash.  You have a fever.  You have redness around your IV site that gets worse. Get help right away if:  You have difficulty breathing.  You have chest pain.  You have blood in your urine or stool, or you vomit blood. Summary  After the procedure, it is common to have a sore throat or nausea. It is also common to feel tired.  Have a responsible adult stay with you for the first 24 hours after general anesthesia. It is important to have someone help care for you until you are awake and alert.  When you feel hungry, start by eating small amounts of foods that are soft and easy to digest (bland), such as toast. Gradually return to your regular diet.  Drink enough fluid to keep your urine pale yellow.  Return to your normal activities as told by your health care provider. Ask your health care provider what activities are safe for you. This information is not intended to replace advice given to you by your health care provider. Make sure you  discuss any questions you have with your health care provider. Document Revised: 11/26/2017 Document Reviewed: 07/09/2017 Elsevier Patient Education  [place]. How to Use Chlorhexidine for Bathing Chlorhexidine gluconate (CHG) is a germ-killing (antiseptic) solution that is used to clean the skin. It can get rid of the bacteria that normally live on the skin and can keep them away for about 24 hours. To clean your skin with CHG, you may be given:  A CHG solution to use in the shower or as part of a sponge bath.  A prepackaged cloth that contains CHG. Cleaning your skin with CHG may help lower the risk for infection:  While you are staying in the intensive care unit of the hospital.  If you have a vascular access, such as a central line, to provide short-term or long-term access to your veins.  If you have a catheter to drain urine from your bladder.  If you are on a ventilator. A ventilator is a machine that helps you breathe by moving air in and out of your lungs.  After surgery. What are the risks? Risks of using CHG include:  A skin reaction.  Hearing loss, if CHG gets in your ears.  Eye injury, if CHG gets in your eyes and is not rinsed out.  The CHG product catching fire. Make sure that you avoid smoking and flames after applying CHG to your skin. Do not use CHG:  If you have a chlorhexidine allergy or have previously reacted to chlorhexidine.  On babies younger than 23 months of age. How to use CHG solution  Use CHG only as told by your health care provider, and follow the instructions on the label.  Use the full amount of CHG as directed. Usually, this is one bottle. During a shower Follow these steps when using CHG solution during a shower (unless your health care provider gives you different instructions): 1. Start the shower. 2. Use your normal soap and shampoo to wash your face and hair. 3. Turn off the shower or move out of the shower stream. 4. Pour  the CHG onto a clean washcloth. Do not use any type of brush or rough-edged sponge. 5. Starting at your neck, lather your body down to  your toes. Make sure you follow these instructions: ? If you will be having surgery, pay special attention to the part of your body where you will be having surgery. Scrub this area for at least 1 minute. ? Do not use CHG on your head or face. If the solution gets into your ears or eyes, rinse them well with water. ? Avoid your genital area. ? Avoid any areas of skin that have broken skin, cuts, or scrapes. ? Scrub your back and under your arms. Make sure to wash skin folds. 6. Let the lather sit on your skin for 1-2 minutes or as long as told by your health care provider. 7. Thoroughly rinse your entire body in the shower. Make sure that all body creases and crevices are rinsed well. 8. Dry off with a clean towel. Do not put any substances on your body afterward--such as powder, lotion, or perfume--unless you are told to do so by your health care provider. Only use lotions that are recommended by the manufacturer. 9. Put on clean clothes or pajamas. 10. If it is the night before your surgery, sleep in clean sheets.  During a sponge bath Follow these steps when using CHG solution during a sponge bath (unless your health care provider gives you different instructions): 1. Use your normal soap and shampoo to wash your face and hair. 2. Pour the CHG onto a clean washcloth. 3. Starting at your neck, lather your body down to your toes. Make sure you follow these instructions: ? If you will be having surgery, pay special attention to the part of your body where you will be having surgery. Scrub this area for at least 1 minute. ? Do not use CHG on your head or face. If the solution gets into your ears or eyes, rinse them well with water. ? Avoid your genital area. ? Avoid any areas of skin that have broken skin, cuts, or scrapes. ? Scrub your back and under your arms.  Make sure to wash skin folds. 4. Let the lather sit on your skin for 1-2 minutes or as long as told by your health care provider. 5. Using a different clean, wet washcloth, thoroughly rinse your entire body. Make sure that all body creases and crevices are rinsed well. 6. Dry off with a clean towel. Do not put any substances on your body afterward--such as powder, lotion, or perfume--unless you are told to do so by your health care provider. Only use lotions that are recommended by the manufacturer. 7. Put on clean clothes or pajamas. 8. If it is the night before your surgery, sleep in clean sheets. How to use CHG prepackaged cloths  Only use CHG cloths as told by your health care provider, and follow the instructions on the label.  Use the CHG cloth on clean, dry skin.  Do not use the CHG cloth on your head or face unless your health care provider tells you to.  When washing with the CHG cloth: ? Avoid your genital area. ? Avoid any areas of skin that have broken skin, cuts, or scrapes. Before surgery Follow these steps when using a CHG cloth to clean before surgery (unless your health care provider gives you different instructions): 1. Using the CHG cloth, vigorously scrub the part of your body where you will be having surgery. Scrub using a back-and-forth motion for 3 minutes. The area on your body should be completely wet with CHG when you are done scrubbing. 2. Do not rinse.  Discard the cloth and let the area air-dry. Do not put any substances on the area afterward, such as powder, lotion, or perfume. 3. Put on clean clothes or pajamas. 4. If it is the night before your surgery, sleep in clean sheets.  For general bathing Follow these steps when using CHG cloths for general bathing (unless your health care provider gives you different instructions). 1. Use a separate CHG cloth for each area of your body. Make sure you wash between any folds of skin and between your fingers and toes.  Wash your body in the following order, switching to a new cloth after each step: ? The front of your neck, shoulders, and chest. ? Both of your arms, under your arms, and your hands. ? Your stomach and groin area, avoiding the genitals. ? Your right leg and foot. ? Your left leg and foot. ? The back of your neck, your back, and your buttocks. 2. Do not rinse. Discard the cloth and let the area air-dry. Do not put any substances on your body afterward--such as powder, lotion, or perfume--unless you are told to do so by your health care provider. Only use lotions that are recommended by the manufacturer. 3. Put on clean clothes or pajamas. Contact a health care provider if:  Your skin gets irritated after scrubbing.  You have questions about using your solution or cloth. Get help right away if:  Your eyes become very red or swollen.  Your eyes itch badly.  Your skin itches badly and is red or swollen.  Your hearing changes.  You have trouble seeing.  You have swelling or tingling in your mouth or throat.  You have trouble breathing.  You swallow any chlorhexidine. Summary  Chlorhexidine gluconate (CHG) is a germ-killing (antiseptic) solution that is used to clean the skin. Cleaning your skin with CHG may help to lower your risk for infection.  You may be given CHG to use for bathing. It may be in a bottle or in a prepackaged cloth to use on your skin. Carefully follow your health care provider's instructions and the instructions on the product label.  Do not use CHG if you have a chlorhexidine allergy.  Contact your health care provider if your skin gets irritated after scrubbing. This information is not intended to replace advice given to you by your health care provider. Make sure you discuss any questions you have with your health care provider. Document Revised: 02/09/2019 Document Reviewed: 10/21/2017 Elsevier Patient Education  [place].

## 2020-08-01 ENCOUNTER — Other Ambulatory Visit: Payer: Self-pay

## 2020-08-01 ENCOUNTER — Ambulatory Visit (HOSPITAL_COMMUNITY): Payer: 59

## 2020-08-01 DIAGNOSIS — M25611 Stiffness of right shoulder, not elsewhere classified: Secondary | ICD-10-CM

## 2020-08-01 DIAGNOSIS — R29898 Other symptoms and signs involving the musculoskeletal system: Secondary | ICD-10-CM

## 2020-08-01 DIAGNOSIS — M25511 Pain in right shoulder: Secondary | ICD-10-CM
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
## 2020-08-02 ENCOUNTER — Encounter (HOSPITAL_COMMUNITY): Payer: Self-pay

## 2020-08-02 ENCOUNTER — Other Ambulatory Visit (HOSPITAL_COMMUNITY)
Admission: RE | Admit: 2020-08-02 | Discharge: 2020-08-02 | Disposition: A | Discharge status: Home / Self Care | Payer: 59 | Source: Ambulatory Visit | Attending: Urology | Admitting: Urology

## 2020-08-02 ENCOUNTER — Other Ambulatory Visit: Payer: Self-pay

## 2020-08-02 ENCOUNTER — Encounter (HOSPITAL_COMMUNITY)
Admission: RE | Admit: 2020-08-02 | Discharge: 2020-08-02 | Disposition: A | Discharge status: Home / Self Care | Payer: 59 | Source: Ambulatory Visit | Attending: Urology | Admitting: Urology

## 2020-08-02 DIAGNOSIS — Z20822 Contact with and (suspected) exposure to covid-19: Secondary | ICD-10-CM | POA: Diagnosis not present

## 2020-08-02 DIAGNOSIS — Z01818 Encounter for other preprocedural examination: Secondary | ICD-10-CM | POA: Insufficient documentation

## 2020-08-02 LAB — BASIC METABOLIC PANEL
Anion gap: 10 (ref 5–15)
BUN: 16 mg/dL (ref 8–23)
CO2: 24 mmol/L (ref 22–32)
Calcium: 9.3 mg/dL (ref 8.9–10.3)
Chloride: 106 mmol/L (ref 98–111)
Creatinine, Ser: 0.53 mg/dL — ABNORMAL LOW (ref 0.61–1.24)
GFR calc Af Amer: >60 mL/min (ref >60)
GFR calc non Af Amer: >60 mL/min (ref >60)
Glucose, Bld: 166 mg/dL — ABNORMAL HIGH (ref 70–99)
Potassium: 4.1 mmol/L (ref 3.5–5.1)
Sodium: 140 mmol/L (ref 135–145)

## 2020-08-02 LAB — HEMOGLOBIN A1C
Hgb A1c MFr Bld: 6.7 % — ABNORMAL HIGH (ref 4.8–5.6)
Mean Plasma Glucose: 145.59 mg/dL

## 2020-08-03 LAB — SARS CORONAVIRUS 2 (TAT 6-24 HRS): SARS Coronavirus 2: NEGATIVE

## 2020-08-05 ENCOUNTER — Encounter (HOSPITAL_COMMUNITY)
Admission: RE | Disposition: A | Discharge status: Home / Self Care | Payer: Self-pay | Source: Home / Self Care | Attending: Urology

## 2020-08-05 ENCOUNTER — Ambulatory Visit (HOSPITAL_COMMUNITY): Payer: 59 | Admitting: Anesthesiology

## 2020-08-05 ENCOUNTER — Encounter (HOSPITAL_COMMUNITY): Payer: Self-pay | Admitting: Urology

## 2020-08-05 ENCOUNTER — Other Ambulatory Visit: Payer: Self-pay

## 2020-08-05 ENCOUNTER — Ambulatory Visit (HOSPITAL_COMMUNITY)
Admission: RE | Admit: 2020-08-05 | Discharge: 2020-08-05 | Disposition: A | Discharge status: Home / Self Care | Payer: 59 | Attending: Urology | Admitting: Urology

## 2020-08-05 DIAGNOSIS — E1122 Type 2 diabetes mellitus with diabetic chronic kidney disease: Secondary | ICD-10-CM | POA: Insufficient documentation

## 2020-08-05 DIAGNOSIS — Z7984 Long term (current) use of oral hypoglycemic drugs: Secondary | ICD-10-CM | POA: Diagnosis not present

## 2020-08-05 DIAGNOSIS — M1990 Unspecified osteoarthritis, unspecified site: Secondary | ICD-10-CM | POA: Insufficient documentation

## 2020-08-05 DIAGNOSIS — N481 Balanitis: Secondary | ICD-10-CM | POA: Diagnosis not present

## 2020-08-05 DIAGNOSIS — K219 Gastro-esophageal reflux disease without esophagitis: Secondary | ICD-10-CM | POA: Diagnosis not present

## 2020-08-05 DIAGNOSIS — G4730 Sleep apnea, unspecified: Secondary | ICD-10-CM | POA: Insufficient documentation

## 2020-08-05 DIAGNOSIS — A6001 Herpesviral infection of penis: Secondary | ICD-10-CM | POA: Insufficient documentation

## 2020-08-05 DIAGNOSIS — B009 Herpesviral infection, unspecified: Secondary | ICD-10-CM | POA: Diagnosis not present

## 2020-08-05 DIAGNOSIS — N189 Chronic kidney disease, unspecified: Secondary | ICD-10-CM | POA: Insufficient documentation

## 2020-08-05 DIAGNOSIS — I129 Hypertensive chronic kidney disease with stage 1 through stage 4 chronic kidney disease, or unspecified chronic kidney disease: Secondary | ICD-10-CM | POA: Insufficient documentation

## 2020-08-05 DIAGNOSIS — Z791 Long term (current) use of non-steroidal anti-inflammatories (NSAID): Secondary | ICD-10-CM | POA: Insufficient documentation

## 2020-08-05 DIAGNOSIS — Z87891 Personal history of nicotine dependence: Secondary | ICD-10-CM | POA: Insufficient documentation

## 2020-08-05 DIAGNOSIS — Z79899 Other long term (current) drug therapy: Secondary | ICD-10-CM | POA: Insufficient documentation

## 2020-08-05 HISTORY — PX: CIRCUMCISION: SHX1350

## 2020-08-05 LAB — GLUCOSE, CAPILLARY
Glucose-Capillary: 146 mg/dL — ABNORMAL HIGH (ref 70–99)
Glucose-Capillary: 138 mg/dL — ABNORMAL HIGH (ref 70–99)

## 2020-08-05 SURGERY — CIRCUMCISION, ADULT
Anesthesia: General | Site: Penis

## 2020-08-05 MED ORDER — PROPOFOL 10 MG/ML IV BOLUS
INTRAVENOUS | Status: DC | PRN
  Administered 2020-08-05: 40 mg via INTRAVENOUS
  Administered 2020-08-05: 220 mg via INTRAVENOUS

## 2020-08-05 MED ORDER — MIDAZOLAM HCL 2 MG/2ML IJ SOLN
INTRAMUSCULAR | Status: DC | PRN
  Administered 2020-08-05: 1 mg via INTRAVENOUS

## 2020-08-05 MED ORDER — EPHEDRINE SULFATE 50 MG/ML IJ SOLN
INTRAMUSCULAR | Status: DC | PRN
  Administered 2020-08-05: 10 mg via INTRAVENOUS

## 2020-08-05 MED ORDER — MEPERIDINE HCL 50 MG/ML IJ SOLN: 6.2500 mg | INTRAMUSCULAR | Status: DC | PRN

## 2020-08-05 MED ORDER — BUPIVACAINE HCL (PF) 0.25 % IJ SOLN
INTRAMUSCULAR | Status: DC | PRN
  Administered 2020-08-05: 10 mL

## 2020-08-05 MED ORDER — CHLORHEXIDINE GLUCONATE 0.12 % MT SOLN
15.0000 mL | Freq: Once | OROMUCOSAL | Status: AC
  Administered 2020-08-05: 15 mL via OROMUCOSAL

## 2020-08-05 MED ORDER — 0.9 % SODIUM CHLORIDE (POUR BTL) OPTIME
TOPICAL | Status: DC | PRN
  Administered 2020-08-05: 1000 mL

## 2020-08-05 MED ORDER — LACTATED RINGERS IV SOLN: Freq: Once | INTRAVENOUS | Status: AC

## 2020-08-05 MED ORDER — GLYCOPYRROLATE 0.2 MG/ML IJ SOLN
INTRAMUSCULAR | Status: DC | PRN
  Administered 2020-08-05: .2 mg via INTRAVENOUS

## 2020-08-05 MED ORDER — LIDOCAINE 2% (20 MG/ML) 5 ML SYRINGE
INTRAMUSCULAR | Status: AC
  Filled 2020-08-05: qty 5

## 2020-08-05 MED ORDER — FENTANYL CITRATE (PF) 250 MCG/5ML IJ SOLN
INTRAMUSCULAR | Status: AC
  Filled 2020-08-05: qty 5

## 2020-08-05 MED ORDER — DEXAMETHASONE SODIUM PHOSPHATE 10 MG/ML IJ SOLN
INTRAMUSCULAR | Status: AC
  Filled 2020-08-05: qty 1

## 2020-08-05 MED ORDER — GLYCOPYRROLATE PF 0.2 MG/ML IJ SOSY
PREFILLED_SYRINGE | INTRAMUSCULAR | Status: AC
  Filled 2020-08-05: qty 1

## 2020-08-05 MED ORDER — PROPOFOL 10 MG/ML IV BOLUS
INTRAVENOUS | Status: AC
  Filled 2020-08-05: qty 40

## 2020-08-05 MED ORDER — CEFAZOLIN SODIUM-DEXTROSE 2-4 GM/100ML-% IV SOLN: 2.0000 g | INTRAVENOUS | Status: DC

## 2020-08-05 MED ORDER — ONDANSETRON HCL 4 MG/2ML IJ SOLN
INTRAMUSCULAR | Status: AC
  Filled 2020-08-05: qty 2

## 2020-08-05 MED ORDER — ORAL CARE MOUTH RINSE: 15.0000 mL | Freq: Once | OROMUCOSAL | Status: AC

## 2020-08-05 MED ORDER — FENTANYL CITRATE (PF) 100 MCG/2ML IJ SOLN
INTRAMUSCULAR | Status: DC | PRN
  Administered 2020-08-05 (×2): 25 ug via INTRAVENOUS
  Administered 2020-08-05 (×2): 50 ug via INTRAVENOUS

## 2020-08-05 MED ORDER — MIDAZOLAM HCL 2 MG/2ML IJ SOLN
INTRAMUSCULAR | Status: AC
  Filled 2020-08-05: qty 2

## 2020-08-05 MED ORDER — BACITRACIN ZINC 500 UNIT/GM EX OINT: TOPICAL_OINTMENT | CUTANEOUS | 0 refills | Status: AC

## 2020-08-05 MED ORDER — HYDROMORPHONE HCL 1 MG/ML IJ SOLN
0.2500 mg | INTRAMUSCULAR | Status: DC | PRN
  Administered 2020-08-05 (×2): 0.5 mg via INTRAVENOUS
  Filled 2020-08-05 (×2): qty 0.5

## 2020-08-05 MED ORDER — OXYCODONE-ACETAMINOPHEN 5-325 MG PO TABS: 1.0000 | ORAL_TABLET | ORAL | 0 refills | Status: AC | PRN

## 2020-08-05 MED ORDER — ONDANSETRON HCL 4 MG/2ML IJ SOLN
INTRAMUSCULAR | Status: DC | PRN
  Administered 2020-08-05: 4 mg via INTRAVENOUS

## 2020-08-05 MED ORDER — PROMETHAZINE HCL 25 MG/ML IJ SOLN: 6.2500 mg | INTRAMUSCULAR | Status: DC | PRN

## 2020-08-05 MED ORDER — SEVOFLURANE IN SOLN
RESPIRATORY_TRACT | Status: AC
  Filled 2020-08-05: qty 250

## 2020-08-05 MED ORDER — BUPIVACAINE HCL (PF) 0.25 % IJ SOLN
INTRAMUSCULAR | Status: AC
  Filled 2020-08-05: qty 30

## 2020-08-05 MED ORDER — CEFAZOLIN SODIUM-DEXTROSE 2-4 GM/100ML-% IV SOLN
INTRAVENOUS | Status: AC
  Filled 2020-08-05: qty 100

## 2020-08-05 SURGICAL SUPPLY — 24 items
BNDG COHESIVE 2X5 TAN STRL LF (GAUZE/BANDAGES/DRESSINGS) ×2 IMPLANT
BNDG CONFORM 2 STRL LF (GAUZE/BANDAGES/DRESSINGS) ×2 IMPLANT
COVER LIGHT HANDLE STERIS (MISCELLANEOUS) ×4 IMPLANT
COVER WAND RF STERILE (DRAPES) ×4 IMPLANT
DECANTER SPIKE VIAL GLASS SM (MISCELLANEOUS) ×2 IMPLANT
DRSG XEROFORM 1X8 (GAUZE/BANDAGES/DRESSINGS) ×2 IMPLANT
ELECT NEEDLE TIP 2.8 STRL (NEEDLE) ×2 IMPLANT
ELECT REM PT RETURN 9FT ADLT (ELECTROSURGICAL) ×2
ELECTRODE REM PT RTRN 9FT ADLT (ELECTROSURGICAL) ×1 IMPLANT
GLOVE BIO SURGEON STRL SZ8 (GLOVE) ×2 IMPLANT
GLOVE BIOGEL PI IND STRL 7.0 (GLOVE) ×2 IMPLANT
GLOVE BIOGEL PI INDICATOR 7.0 (GLOVE) ×2
GLOVE ECLIPSE 6.5 STRL STRAW (GLOVE) ×2 IMPLANT
GOWN STRL REUS W/TWL LRG LVL3 (GOWN DISPOSABLE) ×2 IMPLANT
GOWN STRL REUS W/TWL XL LVL3 (GOWN DISPOSABLE) ×2 IMPLANT
KIT TURNOVER KIT A (KITS) ×4 IMPLANT
NEEDLE HYPO 25X1 1.5 SAFETY (NEEDLE) ×2 IMPLANT
NS IRRIG 1000ML POUR BTL (IV SOLUTION) ×2 IMPLANT
PACK MINOR (CUSTOM PROCEDURE TRAY) ×2 IMPLANT
PAD ARMBOARD 7.5X6 YLW CONV (MISCELLANEOUS) ×2 IMPLANT
SET BASIN LINEN APH (SET/KITS/TRAYS/PACK) ×2 IMPLANT
SUT VIC AB 4-0 SH 27 (SUTURE) ×4
SUT VIC AB 4-0 SH 27XBRD (SUTURE) ×2 IMPLANT
SYR CONTROL 10ML LL (SYRINGE) ×2 IMPLANT

## 2020-08-05 NOTE — Op Note (Signed)
Preoperative diagnosis: balanitis  Postoperative diagnosis: balanitis and penile lesion  Procedure: circumcision, penile lesion excision  Attending: Nicolette Bang, MD  Anesthesia: general  History of blood loss: Minimal  Antibiotics: ancef  Drains: none  Specimens: foreskin, penile lesion  Findings: dorsal subcoronal ulceration. Ulceration extended to the glans.  Indications: Patient is a 64 year old male with a history of recurrent balanoprothitis  After discussing treatment options he decided to proceed with circumcision   Procedure in detail: Prior to procedure consent was obtained.  Patient was brought to the operating room and a brief timeout was done to ensure correct patient, correct procedure, correct site.  General anesthesia was administered and patient was placed in supine position.  His genitalia and abdomen was then prepped and draped in usual sterile fashion.  The phimotic ring was incised sharply and the foreskin was then retracted. We noted a penile lesion 0.5cm which was sharply removed and sent for pathology.  an circumferential incision was made 1.5cm below the coronal ridge. We then pulled the foreskin over the glans and made a separate circumferential incision at the level of the coronal ridge. We then sharply incised the foreskin and then freed it from the dartos using electrocautery. We then sent the foreskin for pathology. Individual bleeders were then cauterized and good hemostasis was noted. We then reapproximated the penile skin to the subcoronal incision. We used interrupted 3-0 vicryl to close the incision. Once this was complete we applied a gauze bandage and this then concluded the procedure which was well tolerated by the patient.  Complications: None  Condition: Stable, extubated, transferred to PACU.  Plan: Patient is to be discharged home.  He is to followup in 2 weeks for a wound check and pathology discussion

## 2020-08-05 NOTE — Transfer of Care (Signed)
Immediate Anesthesia Transfer of Care Note  Patient: William Harvey  Procedure(s) Performed: CIRCUMCISION ADULT (N/A Penis)  Patient Location: PACU  Anesthesia Type:General  Level of Consciousness: awake, alert , oriented and patient cooperative  Airway & Oxygen Therapy: Patient Spontanous Breathing  Post-op Assessment: Report given to RN and Post -op Vital signs reviewed and stable  Post vital signs: Reviewed and stable  Last Vitals:  Vitals Value Taken Time  BP    Temp    Pulse 79 08/05/20 0952  Resp 12 08/05/20 0952  SpO2 95 % 08/05/20 0952  Vitals shown include unvalidated device data.  Last Pain:  Vitals:   08/05/20 0733  TempSrc: Oral  PainSc: 0-No pain      Patients Stated Pain Goal: 7 (99/83/38 2505)  Complications: No complications documented.

## 2020-08-05 NOTE — Anesthesia Preprocedure Evaluation (Addendum)
Anesthesia Evaluation  Patient identified by MRN, date of birth, ID band Patient awake    Reviewed: Allergy & Precautions, NPO status , Patient's Chart, lab work & pertinent test results  Airway Mallampati: II  TM Distance: >3 FB Neck ROM: Full    Dental  (+) Dental Advisory Given, Teeth Intact   Pulmonary sleep apnea , former smoker,    Pulmonary exam normal breath sounds clear to auscultation       Cardiovascular hypertension, Pt. on medications Normal cardiovascular exam Rhythm:Regular Rate:Normal     Neuro/Psych  Headaches, negative psych ROS   GI/Hepatic GERD  Medicated and Controlled,  Endo/Other  diabetes, Well Controlled, Type 2, Oral Hypoglycemic Agents  Renal/GU Renal disease     Musculoskeletal  (+) Arthritis , Osteoarthritis,    Abdominal   Peds  Hematology   Anesthesia Other Findings   Reproductive/Obstetrics                            Anesthesia Physical Anesthesia Plan  ASA: II  Anesthesia Plan: General   Post-op Pain Management:    Induction: Intravenous  PONV Risk Score and Plan: 4 or greater and Ondansetron and Midazolam  Airway Management Planned: LMA  Additional Equipment:   Intra-op Plan:   Post-operative Plan: Extubation in OR  Informed Consent: I have reviewed the patients History and Physical, chart, labs and discussed the procedure including the risks, benefits and alternatives for the proposed anesthesia with the patient or authorized representative who has indicated his/her understanding and acceptance.     Dental advisory given  Plan Discussed with: CRNA and Surgeon  Anesthesia Plan Comments:         Anesthesia Quick Evaluation

## 2020-08-05 NOTE — Discharge Instructions (Signed)
Circumcision, Adult, Care After This sheet gives you information about how to care for yourself after your procedure. Your doctor may also give you more specific instructions. If you have problems or questions, contact your doctor. Follow these instructions at home: Cut care   Follow instructions from your doctor about how to take care of your cut from surgery (incision). Make sure you: ? Wash your hands with soap and water before you change your bandage (dressing). If you cannot use soap and water, use hand sanitizer. ? Change your bandage as told by your doctor. ? Leave stitches (sutures), skin glue, or skin tape (adhesive) strips in place. They may need to stay in place for 2 weeks or longer. If tape strips get loose and curl up, you may trim the loose edges. Do not remove tape strips completely unless your doctor says it is okay  Check your cut area every day for signs of infection. Check for: ? More redness, swelling, or pain. ? More fluid or blood. ? Warmth. ? Pus or a bad smell. Bathing  Do not take baths, swim, or use a hot tub until your doctor says it is okay. Ask your doctor if you can take showers. You may only be allowed to take sponge baths for bathing.  Do not get your cut area wet during the 24 hours after the procedure, or as long as told by your doctor.  When you can shower, do not rub the cut area. Gently pat it dry. General instructions  Avoid heavy lifting, contact sports, biking, or swimming until your doctor says it is okay.  Do not have sex until your doctor says it is okay.  Take or apply over-the-counter and prescription medicines only as told by your doctor.  Keep all follow-up visits as told by your doctor. This is important. Contact a doctor if:  Medicine does not help your pain.  You have more redness, swelling, or pain around your cut.  You have more fluid or blood coming from your cut.  Your cut feels warm to the touch.  You have pus or a bad  smell coming from your cut.  You have a fever. Get help right away if:  You cannot pee (urinate).  It hurts to pee.  Your pain is not helped by medicines.  There is redness, swelling, and soreness that spreads up the shaft of your penis, your thighs, or your lower belly (abdomen).  You have bleeding that does not stop when you press on it. Summary  Follow instructions from your doctor about how to take care of your cut from surgery (incision).  Check your cut area every day for signs of infection.  Do not have sex until your doctor says it is okay. This information is not intended to replace advice given to you by your health care provider. Make sure you discuss any questions you have with your health care provider. Document Revised: 11/05/2017 Document Reviewed: 12/01/2016 Elsevier Patient Education  2020 [employer]vier Inc.  
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
## 2020-08-05 NOTE — Anesthesia Procedure Notes (Signed)
Procedure Name: LMA Insertion Date/Time: 08/05/2020 9:13 AM Performed by: Georgeanne Nim, CRNA Pre-anesthesia Checklist: Patient identified, Emergency Drugs available, Suction available, Patient being monitored and Timeout performed Patient Re-evaluated:Patient Re-evaluated prior to induction Oxygen Delivery Method: Circle system utilized Preoxygenation: Pre-oxygenation with 100% oxygen Induction Type: IV induction LMA: LMA inserted LMA Size: 5.0 Number of attempts: 1 Airway Equipment and Method: Patient positioned with wedge pillow Placement Confirmation: positive ETCO2,  CO2 detector and breath sounds checked- equal and bilateral Tube secured with: Tape Dental Injury: Teeth and Oropharynx as per pre-operative assessment

## 2020-08-05 NOTE — Anesthesia Postprocedure Evaluation (Signed)
Anesthesia Post Note  Patient: William Harvey  Procedure(s) Performed: CIRCUMCISION ADULT (N/A Penis)  Patient location during evaluation: PACU Anesthesia Type: General Level of consciousness: awake and alert Pain management: pain level controlled Vital Signs Assessment: post-procedure vital signs reviewed and stable Respiratory status: spontaneous breathing Cardiovascular status: stable Postop Assessment: no apparent nausea or vomiting Anesthetic complications: no Comments: Pt complains of right shoulder pain (6 wk postop shoulder surgery). Able to move arms bil. Right arm tucked for surgery and pt verbalized comfort prior to induction.    No complications documented.   Last Vitals:  Vitals:   08/05/20 0733 08/05/20 0951  BP: (!) 165/84 (!) 167/83  Pulse:  79  Resp: 18 12  Temp: 36.8 C 36.5 C  SpO2: 96% 95%    Last Pain:  Vitals:   08/05/20 0733  TempSrc: Oral  PainSc: 0-No pain                 Everette Rank

## 2020-08-06 ENCOUNTER — Encounter (HOSPITAL_COMMUNITY): Payer: 59

## 2020-08-06 ENCOUNTER — Encounter (HOSPITAL_COMMUNITY): Payer: Self-pay | Admitting: Urology

## 2020-08-08 ENCOUNTER — Encounter (HOSPITAL_COMMUNITY): Payer: 59

## 2020-08-08 LAB — SURGICAL PATHOLOGY

## 2020-08-13 ENCOUNTER — Encounter (HOSPITAL_COMMUNITY): Payer: Self-pay

## 2020-08-13 ENCOUNTER — Other Ambulatory Visit: Payer: Self-pay

## 2020-08-13 ENCOUNTER — Ambulatory Visit (HOSPITAL_COMMUNITY): Payer: 59 | Attending: Orthopaedic Surgery

## 2020-08-13 DIAGNOSIS — M25511 Pain in right shoulder: Secondary | ICD-10-CM | POA: Diagnosis present

## 2020-08-13 DIAGNOSIS — R29898 Other symptoms and signs involving the musculoskeletal system: Secondary | ICD-10-CM | POA: Diagnosis present

## 2020-08-13 DIAGNOSIS — M25611 Stiffness of right shoulder, not elsewhere classified: Secondary | ICD-10-CM | POA: Diagnosis present
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
## 2020-08-15 ENCOUNTER — Encounter (HOSPITAL_COMMUNITY): Payer: Self-pay

## 2020-08-15 ENCOUNTER — Other Ambulatory Visit: Payer: Self-pay

## 2020-08-15 ENCOUNTER — Ambulatory Visit (HOSPITAL_COMMUNITY): Payer: 59

## 2020-08-15 DIAGNOSIS — R29898 Other symptoms and signs involving the musculoskeletal system: Secondary | ICD-10-CM | POA: Diagnosis not present

## 2020-08-15 DIAGNOSIS — M25611 Stiffness of right shoulder, not elsewhere classified: Secondary | ICD-10-CM

## 2020-08-15 DIAGNOSIS — M25511 Pain in right shoulder: Secondary | ICD-10-CM
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
## 2020-08-20 ENCOUNTER — Other Ambulatory Visit: Payer: Self-pay

## 2020-08-20 ENCOUNTER — Ambulatory Visit (HOSPITAL_COMMUNITY): Payer: 59

## 2020-08-20 ENCOUNTER — Encounter (HOSPITAL_COMMUNITY): Payer: Self-pay

## 2020-08-20 DIAGNOSIS — R29898 Other symptoms and signs involving the musculoskeletal system: Secondary | ICD-10-CM

## 2020-08-20 DIAGNOSIS — M25611 Stiffness of right shoulder, not elsewhere classified: Secondary | ICD-10-CM

## 2020-08-20 DIAGNOSIS — M25511 Pain in right shoulder: Secondary | ICD-10-CM

## 2020-08-20 NOTE — Patient Instructions (Signed)
pec corner stretch  Find a comfortable position for your hands and lightly lean forward into the corner until you feel a good stretch in your pecs. Hold 15-20 seconds. Complete 2 times.     Scapular Push Up   Put your hands on the wall out in front of you. Initiate movement by bringing shoulders away from the ears.  Squeeze the shoulder blades together and hold for 6 second and then push through the wall to push shoulder blades apart.  Complete 10-15 reps. 1 set
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
## 2020-08-21 ENCOUNTER — Telehealth: Payer: Self-pay | Admitting: Urology

## 2020-08-21 NOTE — Telephone Encounter (Signed)
Created in error

## 2020-08-22 ENCOUNTER — Ambulatory Visit (HOSPITAL_COMMUNITY): Payer: 59

## 2020-08-22 ENCOUNTER — Other Ambulatory Visit: Payer: Self-pay

## 2020-08-22 ENCOUNTER — Encounter (HOSPITAL_COMMUNITY): Payer: Self-pay

## 2020-08-22 DIAGNOSIS — R29898 Other symptoms and signs involving the musculoskeletal system: Secondary | ICD-10-CM | POA: Diagnosis not present

## 2020-08-22 DIAGNOSIS — M25511 Pain in right shoulder: Secondary | ICD-10-CM

## 2020-08-22 DIAGNOSIS — M25611 Stiffness of right shoulder, not elsewhere classified: Secondary | ICD-10-CM
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
## 2020-08-27 ENCOUNTER — Other Ambulatory Visit: Payer: Self-pay

## 2020-08-27 ENCOUNTER — Encounter: Payer: Self-pay | Admitting: Urology

## 2020-08-27 ENCOUNTER — Ambulatory Visit (HOSPITAL_COMMUNITY): Payer: 59

## 2020-08-27 ENCOUNTER — Ambulatory Visit (INDEPENDENT_AMBULATORY_CARE_PROVIDER_SITE_OTHER): Payer: 59 | Admitting: Urology

## 2020-08-27 ENCOUNTER — Encounter (HOSPITAL_COMMUNITY): Payer: Self-pay

## 2020-08-27 VITALS — BP 164/81 | HR 81 | Temp 97.9°F | Ht 73.0 in | Wt 200.0 lb

## 2020-08-27 DIAGNOSIS — A6002 Herpesviral infection of other male genital organs: Secondary | ICD-10-CM

## 2020-08-27 DIAGNOSIS — M25611 Stiffness of right shoulder, not elsewhere classified: Secondary | ICD-10-CM

## 2020-08-27 DIAGNOSIS — R29898 Other symptoms and signs involving the musculoskeletal system: Secondary | ICD-10-CM | POA: Diagnosis not present

## 2020-08-27 DIAGNOSIS — M25511 Pain in right shoulder: Secondary | ICD-10-CM

## 2020-08-27 DIAGNOSIS — N481 Balanitis: Secondary | ICD-10-CM

## 2020-08-27 LAB — URINALYSIS, ROUTINE W REFLEX MICROSCOPIC
Bilirubin, UA: NEGATIVE
Ketones, UA: NEGATIVE
Leukocytes,UA: NEGATIVE
Nitrite, UA: NEGATIVE
Protein,UA: NEGATIVE
RBC, UA: NEGATIVE
Specific Gravity, UA: 1.015 (ref 1.005–1.030)
Urobilinogen, Ur: 0.2 mg/dL (ref 0.2–1.0)
pH, UA: 6 (ref 5.0–7.5)

## 2020-08-27 MED ORDER — CLOTRIMAZOLE-BETAMETHASONE 1-0.05 % EX CREA: 1.0000 "application " | TOPICAL_CREAM | Freq: Two times a day (BID) | CUTANEOUS | 0 refills | Status: DC

## 2020-08-27 MED ORDER — ACYCLOVIR 400 MG PO TABS: 400.0000 mg | ORAL_TABLET | Freq: Three times a day (TID) | ORAL | 1 refills | Status: DC
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
## 2020-08-27 NOTE — Progress Notes (Signed)

## 2020-08-27 NOTE — Patient Instructions (Signed)

## 2020-08-27 NOTE — Addendum Note (Signed)
Addended by: Ailene Ravel D on: 08/27/2020 02:02 PM   Modules accepted: Orders

## 2020-08-29 ENCOUNTER — Ambulatory Visit (HOSPITAL_COMMUNITY): Payer: 59

## 2020-08-29 ENCOUNTER — Encounter (HOSPITAL_COMMUNITY): Payer: Self-pay

## 2020-08-29 ENCOUNTER — Other Ambulatory Visit: Payer: Self-pay

## 2020-08-29 DIAGNOSIS — R29898 Other symptoms and signs involving the musculoskeletal system: Secondary | ICD-10-CM

## 2020-08-29 DIAGNOSIS — M25511 Pain in right shoulder: Secondary | ICD-10-CM

## 2020-08-29 DIAGNOSIS — M25611 Stiffness of right shoulder, not elsewhere classified: Secondary | ICD-10-CM
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
## 2020-09-02 ENCOUNTER — Telehealth (HOSPITAL_COMMUNITY): Payer: Self-pay | Admitting: Specialist

## 2020-09-02 ENCOUNTER — Ambulatory Visit (HOSPITAL_COMMUNITY): Payer: 59 | Admitting: Specialist

## 2020-09-02 NOTE — Telephone Encounter (Signed)
pt cancelled appt on 9/30 because she has an appt with the MD

## 2020-09-05 ENCOUNTER — Ambulatory Visit (HOSPITAL_COMMUNITY): Payer: 59 | Admitting: Specialist

## 2020-09-17 ENCOUNTER — Encounter (HOSPITAL_COMMUNITY): Payer: Self-pay | Admitting: Occupational Therapy

## 2020-09-17 ENCOUNTER — Other Ambulatory Visit: Payer: Self-pay

## 2020-09-17 ENCOUNTER — Ambulatory Visit (HOSPITAL_COMMUNITY): Payer: 59 | Attending: Orthopaedic Surgery | Admitting: Occupational Therapy

## 2020-09-17 DIAGNOSIS — R29898 Other symptoms and signs involving the musculoskeletal system: Secondary | ICD-10-CM | POA: Diagnosis present

## 2020-09-17 DIAGNOSIS — M25511 Pain in right shoulder: Secondary | ICD-10-CM | POA: Diagnosis present

## 2020-09-17 DIAGNOSIS — M25611 Stiffness of right shoulder, not elsewhere classified: Secondary | ICD-10-CM | POA: Insufficient documentation

## 2020-09-17 NOTE — Patient Instructions (Signed)
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
## 2020-09-19 ENCOUNTER — Ambulatory Visit (HOSPITAL_COMMUNITY): Payer: 59

## 2020-09-19 ENCOUNTER — Other Ambulatory Visit: Payer: Self-pay

## 2020-09-19 ENCOUNTER — Encounter (HOSPITAL_COMMUNITY): Payer: Self-pay

## 2020-09-19 DIAGNOSIS — M25611 Stiffness of right shoulder, not elsewhere classified: Secondary | ICD-10-CM

## 2020-09-19 DIAGNOSIS — R29898 Other symptoms and signs involving the musculoskeletal system: Secondary | ICD-10-CM | POA: Diagnosis not present

## 2020-09-19 DIAGNOSIS — M25511 Pain in right shoulder: Secondary | ICD-10-CM
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
## 2020-09-24 ENCOUNTER — Encounter (HOSPITAL_COMMUNITY): Payer: Self-pay

## 2020-09-24 ENCOUNTER — Other Ambulatory Visit: Payer: Self-pay

## 2020-09-24 ENCOUNTER — Ambulatory Visit (HOSPITAL_COMMUNITY): Payer: 59

## 2020-09-24 DIAGNOSIS — R29898 Other symptoms and signs involving the musculoskeletal system: Secondary | ICD-10-CM

## 2020-09-24 DIAGNOSIS — M25611 Stiffness of right shoulder, not elsewhere classified: Secondary | ICD-10-CM

## 2020-09-24 DIAGNOSIS — M25511 Pain in right shoulder: Secondary | ICD-10-CM
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
## 2020-09-26 ENCOUNTER — Ambulatory Visit (HOSPITAL_COMMUNITY): Payer: 59

## 2020-10-02 ENCOUNTER — Ambulatory Visit: Payer: 59 | Admitting: Urology

## 2020-10-09 ENCOUNTER — Ambulatory Visit: Payer: 59 | Admitting: Urology

## 2020-11-22 ENCOUNTER — Other Ambulatory Visit: Payer: Self-pay | Admitting: Urology

## 2022-02-12 DIAGNOSIS — M7501 Adhesive capsulitis of right shoulder: Secondary | ICD-10-CM | POA: Diagnosis not present

## 2022-02-12 DIAGNOSIS — E7849 Other hyperlipidemia: Secondary | ICD-10-CM | POA: Diagnosis not present

## 2022-02-12 DIAGNOSIS — I10 Essential (primary) hypertension: Secondary | ICD-10-CM | POA: Diagnosis not present

## 2022-02-12 DIAGNOSIS — E1143 Type 2 diabetes mellitus with diabetic autonomic (poly)neuropathy: Secondary | ICD-10-CM | POA: Diagnosis not present

## 2022-06-15 DIAGNOSIS — Z0000 Encounter for general adult medical examination without abnormal findings: Secondary | ICD-10-CM | POA: Diagnosis not present

## 2022-06-15 DIAGNOSIS — I10 Essential (primary) hypertension: Secondary | ICD-10-CM | POA: Diagnosis not present

## 2022-06-15 DIAGNOSIS — Z6827 Body mass index (BMI) 27.0-27.9, adult: Secondary | ICD-10-CM | POA: Diagnosis not present

## 2022-06-15 DIAGNOSIS — E1143 Type 2 diabetes mellitus with diabetic autonomic (poly)neuropathy: Secondary | ICD-10-CM | POA: Diagnosis not present

## 2022-06-15 DIAGNOSIS — E7849 Other hyperlipidemia: Secondary | ICD-10-CM | POA: Diagnosis not present

## 2022-10-16 DIAGNOSIS — Z0489 Encounter for examination and observation for other specified reasons: Secondary | ICD-10-CM | POA: Diagnosis not present

## 2022-10-19 DIAGNOSIS — I10 Essential (primary) hypertension: Secondary | ICD-10-CM | POA: Diagnosis not present

## 2022-10-19 DIAGNOSIS — M25511 Pain in right shoulder: Secondary | ICD-10-CM | POA: Diagnosis not present

## 2022-10-19 DIAGNOSIS — E1143 Type 2 diabetes mellitus with diabetic autonomic (poly)neuropathy: Secondary | ICD-10-CM | POA: Diagnosis not present

## 2022-10-19 DIAGNOSIS — E7849 Other hyperlipidemia: Secondary | ICD-10-CM | POA: Diagnosis not present

## 2022-11-20 DIAGNOSIS — M75102 Unspecified rotator cuff tear or rupture of left shoulder, not specified as traumatic: Secondary | ICD-10-CM | POA: Diagnosis not present

## 2022-11-20 DIAGNOSIS — M25512 Pain in left shoulder: Secondary | ICD-10-CM | POA: Diagnosis not present

## 2022-12-02 DIAGNOSIS — M75112 Incomplete rotator cuff tear or rupture of left shoulder, not specified as traumatic: Secondary | ICD-10-CM | POA: Diagnosis not present

## 2022-12-02 DIAGNOSIS — M19012 Primary osteoarthritis, left shoulder: Secondary | ICD-10-CM | POA: Diagnosis not present

## 2022-12-02 DIAGNOSIS — M67912 Unspecified disorder of synovium and tendon, left shoulder: Secondary | ICD-10-CM | POA: Diagnosis not present

## 2022-12-16 DIAGNOSIS — M75122 Complete rotator cuff tear or rupture of left shoulder, not specified as traumatic: Secondary | ICD-10-CM | POA: Diagnosis not present

## 2022-12-18 DIAGNOSIS — M75102 Unspecified rotator cuff tear or rupture of left shoulder, not specified as traumatic: Secondary | ICD-10-CM | POA: Diagnosis not present

## 2023-02-18 DIAGNOSIS — E1143 Type 2 diabetes mellitus with diabetic autonomic (poly)neuropathy: Secondary | ICD-10-CM | POA: Diagnosis not present

## 2023-02-18 DIAGNOSIS — E7849 Other hyperlipidemia: Secondary | ICD-10-CM | POA: Diagnosis not present

## 2023-02-18 DIAGNOSIS — Z125 Encounter for screening for malignant neoplasm of prostate: Secondary | ICD-10-CM | POA: Diagnosis not present

## 2023-02-18 DIAGNOSIS — M25511 Pain in right shoulder: Secondary | ICD-10-CM | POA: Diagnosis not present

## 2023-02-18 DIAGNOSIS — M25019 Hemarthrosis, unspecified shoulder: Secondary | ICD-10-CM | POA: Diagnosis not present

## 2023-02-18 DIAGNOSIS — I10 Essential (primary) hypertension: Secondary | ICD-10-CM | POA: Diagnosis not present

## 2023-03-19 ENCOUNTER — Encounter (HOSPITAL_COMMUNITY): Payer: Self-pay | Admitting: Occupational Therapy

## 2023-03-19 ENCOUNTER — Ambulatory Visit (HOSPITAL_COMMUNITY): Payer: BC Managed Care – PPO | Attending: Sports Medicine | Admitting: Occupational Therapy

## 2023-03-19 DIAGNOSIS — R29898 Other symptoms and signs involving the musculoskeletal system: Secondary | ICD-10-CM | POA: Diagnosis not present

## 2023-03-19 DIAGNOSIS — M25512 Pain in left shoulder: Secondary | ICD-10-CM | POA: Diagnosis not present

## 2023-03-19 DIAGNOSIS — M25612 Stiffness of left shoulder, not elsewhere classified: Secondary | ICD-10-CM | POA: Insufficient documentation

## 2023-03-19 NOTE — Patient Instructions (Signed)
1) SHOULDER: Flexion On Table   Place hands on towel placed on table, elbows straight. Lean forward with you upper body, pushing towel away from body.  _10__ reps per set, __2_ sets per day  2) Abduction (Passive)   With arm out to side, resting on towel placed on table with palm DOWN, keeping trunk away from table, lean to the side while pushing towel away from body.  Repeat __10__ times. Do ___2_ sessions per day.  Copyright  VHI. All rights reserved.     3) Internal Rotation (Assistive)   Seated with elbow bent at right angle and held against side, slide arm on table surface in an inward arc keeping elbow anchored in place. Repeat __10__ times. Do __2__ sessions per day. Activity: Use this motion to brush crumbs off the table.  Copyright  VHI. All rights reserved.   
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
## 2023-04-07 DIAGNOSIS — M75102 Unspecified rotator cuff tear or rupture of left shoulder, not specified as traumatic: Secondary | ICD-10-CM | POA: Diagnosis not present

## 2023-04-29 ENCOUNTER — Encounter: Payer: Self-pay | Admitting: *Deleted

## 2023-05-06 DIAGNOSIS — E119 Type 2 diabetes mellitus without complications: Secondary | ICD-10-CM | POA: Diagnosis not present

## 2023-05-06 DIAGNOSIS — M7542 Impingement syndrome of left shoulder: Secondary | ICD-10-CM | POA: Diagnosis not present

## 2023-05-06 DIAGNOSIS — Z79899 Other long term (current) drug therapy: Secondary | ICD-10-CM | POA: Diagnosis not present

## 2023-05-06 DIAGNOSIS — I10 Essential (primary) hypertension: Secondary | ICD-10-CM | POA: Diagnosis not present

## 2023-05-06 DIAGNOSIS — M75122 Complete rotator cuff tear or rupture of left shoulder, not specified as traumatic: Secondary | ICD-10-CM | POA: Diagnosis not present

## 2023-05-06 DIAGNOSIS — M7552 Bursitis of left shoulder: Secondary | ICD-10-CM | POA: Diagnosis not present

## 2023-05-06 DIAGNOSIS — M75102 Unspecified rotator cuff tear or rupture of left shoulder, not specified as traumatic: Secondary | ICD-10-CM | POA: Diagnosis not present

## 2023-05-06 DIAGNOSIS — G8918 Other acute postprocedural pain: Secondary | ICD-10-CM | POA: Diagnosis not present

## 2023-05-19 DIAGNOSIS — M75102 Unspecified rotator cuff tear or rupture of left shoulder, not specified as traumatic: Secondary | ICD-10-CM | POA: Diagnosis not present

## 2023-06-17 DIAGNOSIS — Z0000 Encounter for general adult medical examination without abnormal findings: Secondary | ICD-10-CM | POA: Diagnosis not present

## 2023-06-17 DIAGNOSIS — Z6826 Body mass index (BMI) 26.0-26.9, adult: Secondary | ICD-10-CM | POA: Diagnosis not present

## 2023-08-30 DIAGNOSIS — Z9889 Other specified postprocedural states: Secondary | ICD-10-CM | POA: Diagnosis not present

## 2023-08-30 DIAGNOSIS — M25612 Stiffness of left shoulder, not elsewhere classified: Secondary | ICD-10-CM | POA: Diagnosis not present

## 2023-10-05 DIAGNOSIS — Z0000 Encounter for general adult medical examination without abnormal findings: Secondary | ICD-10-CM | POA: Diagnosis not present

## 2023-10-05 DIAGNOSIS — I10 Essential (primary) hypertension: Secondary | ICD-10-CM | POA: Diagnosis not present

## 2023-10-05 DIAGNOSIS — M25019 Hemarthrosis, unspecified shoulder: Secondary | ICD-10-CM | POA: Diagnosis not present

## 2023-10-05 DIAGNOSIS — E7849 Other hyperlipidemia: Secondary | ICD-10-CM | POA: Diagnosis not present

## 2023-10-05 DIAGNOSIS — E1143 Type 2 diabetes mellitus with diabetic autonomic (poly)neuropathy: Secondary | ICD-10-CM | POA: Diagnosis not present

## 2024-01-04 DIAGNOSIS — E7849 Other hyperlipidemia: Secondary | ICD-10-CM | POA: Diagnosis not present

## 2024-01-04 DIAGNOSIS — M25019 Hemarthrosis, unspecified shoulder: Secondary | ICD-10-CM | POA: Diagnosis not present

## 2024-01-04 DIAGNOSIS — Z0000 Encounter for general adult medical examination without abnormal findings: Secondary | ICD-10-CM | POA: Diagnosis not present

## 2024-01-04 DIAGNOSIS — E1143 Type 2 diabetes mellitus with diabetic autonomic (poly)neuropathy: Secondary | ICD-10-CM | POA: Diagnosis not present

## 2024-01-04 DIAGNOSIS — I10 Essential (primary) hypertension: Secondary | ICD-10-CM | POA: Diagnosis not present

## 2024-01-11 DIAGNOSIS — Z136 Encounter for screening for cardiovascular disorders: Secondary | ICD-10-CM | POA: Diagnosis not present

## 2024-02-10 DIAGNOSIS — M810 Age-related osteoporosis without current pathological fracture: Secondary | ICD-10-CM | POA: Diagnosis not present

## 2024-02-14 DIAGNOSIS — M9905 Segmental and somatic dysfunction of pelvic region: Secondary | ICD-10-CM | POA: Diagnosis not present

## 2024-02-14 DIAGNOSIS — M9902 Segmental and somatic dysfunction of thoracic region: Secondary | ICD-10-CM | POA: Diagnosis not present

## 2024-02-14 DIAGNOSIS — M9903 Segmental and somatic dysfunction of lumbar region: Secondary | ICD-10-CM | POA: Diagnosis not present

## 2024-02-14 DIAGNOSIS — M5442 Lumbago with sciatica, left side: Secondary | ICD-10-CM | POA: Diagnosis not present

## 2024-02-21 DIAGNOSIS — M5442 Lumbago with sciatica, left side: Secondary | ICD-10-CM | POA: Diagnosis not present

## 2024-02-21 DIAGNOSIS — M9903 Segmental and somatic dysfunction of lumbar region: Secondary | ICD-10-CM | POA: Diagnosis not present

## 2024-02-21 DIAGNOSIS — M9902 Segmental and somatic dysfunction of thoracic region: Secondary | ICD-10-CM | POA: Diagnosis not present

## 2024-02-21 DIAGNOSIS — M9905 Segmental and somatic dysfunction of pelvic region: Secondary | ICD-10-CM | POA: Diagnosis not present

## 2024-02-28 DIAGNOSIS — M9905 Segmental and somatic dysfunction of pelvic region: Secondary | ICD-10-CM | POA: Diagnosis not present

## 2024-02-28 DIAGNOSIS — M9902 Segmental and somatic dysfunction of thoracic region: Secondary | ICD-10-CM | POA: Diagnosis not present

## 2024-02-28 DIAGNOSIS — M9903 Segmental and somatic dysfunction of lumbar region: Secondary | ICD-10-CM | POA: Diagnosis not present

## 2024-02-28 DIAGNOSIS — M5442 Lumbago with sciatica, left side: Secondary | ICD-10-CM | POA: Diagnosis not present

## 2024-03-21 DIAGNOSIS — M5430 Sciatica, unspecified side: Secondary | ICD-10-CM | POA: Diagnosis not present

## 2024-03-21 DIAGNOSIS — G6189 Other inflammatory polyneuropathies: Secondary | ICD-10-CM | POA: Diagnosis not present

## 2024-03-21 DIAGNOSIS — M25519 Pain in unspecified shoulder: Secondary | ICD-10-CM | POA: Diagnosis not present

## 2024-05-02 DIAGNOSIS — G6189 Other inflammatory polyneuropathies: Secondary | ICD-10-CM | POA: Diagnosis not present

## 2024-05-02 DIAGNOSIS — I10 Essential (primary) hypertension: Secondary | ICD-10-CM | POA: Diagnosis not present

## 2024-05-02 DIAGNOSIS — E7849 Other hyperlipidemia: Secondary | ICD-10-CM | POA: Diagnosis not present

## 2024-05-02 DIAGNOSIS — M5430 Sciatica, unspecified side: Secondary | ICD-10-CM | POA: Diagnosis not present

## 2024-05-02 DIAGNOSIS — E1143 Type 2 diabetes mellitus with diabetic autonomic (poly)neuropathy: Secondary | ICD-10-CM | POA: Diagnosis not present

## 2024-05-02 DIAGNOSIS — Z0000 Encounter for general adult medical examination without abnormal findings: Secondary | ICD-10-CM | POA: Diagnosis not present

## 2024-05-02 DIAGNOSIS — M25519 Pain in unspecified shoulder: Secondary | ICD-10-CM | POA: Diagnosis not present

## 2024-07-25 DIAGNOSIS — M1611 Unilateral primary osteoarthritis, right hip: Secondary | ICD-10-CM | POA: Diagnosis not present

## 2024-07-25 DIAGNOSIS — M25551 Pain in right hip: Secondary | ICD-10-CM | POA: Diagnosis not present

## 2024-07-25 DIAGNOSIS — R52 Pain, unspecified: Secondary | ICD-10-CM | POA: Diagnosis not present

## 2024-08-02 DIAGNOSIS — M1611 Unilateral primary osteoarthritis, right hip: Secondary | ICD-10-CM | POA: Diagnosis not present

## 2024-08-28 DIAGNOSIS — M1611 Unilateral primary osteoarthritis, right hip: Secondary | ICD-10-CM | POA: Diagnosis not present

## 2024-09-04 DIAGNOSIS — G6189 Other inflammatory polyneuropathies: Secondary | ICD-10-CM | POA: Diagnosis not present

## 2024-09-04 DIAGNOSIS — E1143 Type 2 diabetes mellitus with diabetic autonomic (poly)neuropathy: Secondary | ICD-10-CM | POA: Diagnosis not present

## 2024-09-04 DIAGNOSIS — M5430 Sciatica, unspecified side: Secondary | ICD-10-CM | POA: Diagnosis not present

## 2024-09-04 DIAGNOSIS — M25551 Pain in right hip: Secondary | ICD-10-CM | POA: Diagnosis not present

## 2024-09-04 DIAGNOSIS — I10 Essential (primary) hypertension: Secondary | ICD-10-CM | POA: Diagnosis not present

## 2024-09-04 DIAGNOSIS — E7849 Other hyperlipidemia: Secondary | ICD-10-CM | POA: Diagnosis not present
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
## 2024-09-06 ENCOUNTER — Encounter (HOSPITAL_COMMUNITY): Payer: Self-pay

## 2024-09-06 ENCOUNTER — Encounter (HOSPITAL_COMMUNITY)
Admission: RE | Admit: 2024-09-06 | Discharge: 2024-09-06 | Disposition: A | Discharge status: Home / Self Care | Payer: Self-pay | Source: Ambulatory Visit | Attending: Orthopedic Surgery | Admitting: Orthopedic Surgery

## 2024-09-06 ENCOUNTER — Other Ambulatory Visit: Payer: Self-pay

## 2024-09-06 VITALS — BP 130/67 | HR 60 | Temp 98.3°F | Resp 16 | Ht 75.0 in | Wt 188.0 lb

## 2024-09-06 DIAGNOSIS — Z01818 Encounter for other preprocedural examination: Secondary | ICD-10-CM | POA: Diagnosis not present

## 2024-09-06 DIAGNOSIS — E119 Type 2 diabetes mellitus without complications: Secondary | ICD-10-CM | POA: Diagnosis not present

## 2024-09-06 LAB — CBC
HCT: 42.4 % (ref 39.0–52.0)
Hemoglobin: 13.1 g/dL (ref 13.0–17.0)
MCH: 26.8 pg (ref 26.0–34.0)
MCHC: 30.9 g/dL (ref 30.0–36.0)
MCV: 86.7 fL (ref 80.0–100.0)
Platelets: 240 K/uL (ref 150–400)
RBC: 4.89 MIL/uL (ref 4.22–5.81)
RDW: 13.3 % (ref 11.5–15.5)
WBC: 8.6 K/uL (ref 4.0–10.5)
nRBC: 0 % (ref 0.0–0.2)

## 2024-09-06 LAB — HEMOGLOBIN A1C
Hgb A1c MFr Bld: 6.9 % — ABNORMAL HIGH (ref 4.8–5.6)
Mean Plasma Glucose: 151.33 mg/dL

## 2024-09-06 LAB — BASIC METABOLIC PANEL WITH GFR
Anion gap: 13 (ref 5–15)
BUN: 23 mg/dL (ref 8–23)
CO2: 24 mmol/L (ref 22–32)
Calcium: 9.5 mg/dL (ref 8.9–10.3)
Chloride: 103 mmol/L (ref 98–111)
Creatinine, Ser: 0.62 mg/dL (ref 0.61–1.24)
GFR, Estimated: >60 mL/min (ref >60)
Glucose, Bld: 140 mg/dL — ABNORMAL HIGH (ref 70–99)
Potassium: 4.4 mmol/L (ref 3.5–5.1)
Sodium: 139 mmol/L (ref 135–145)

## 2024-09-06 LAB — SURGICAL PCR SCREEN
MRSA, PCR: NEGATIVE
Staphylococcus aureus: NEGATIVE

## 2024-09-06 LAB — GLUCOSE, CAPILLARY: Glucose-Capillary: 158 mg/dL — ABNORMAL HIGH (ref 70–99)

## 2024-09-06 NOTE — Progress Notes (Addendum)
 COVID Vaccine Completed: yes  Date of COVID positive in last 90 days:  PCP - Yancey Reno, MD Cardiologist - n/a  Clearance in media tab dated 08/25/24 in Epic  Chest x-ray - N/A EKG - 09/06/24 Epic/chart Stress Test - N/A ECHO - N/A Cardiac Cath - n/a Pacemaker/ICD device last checked:N/A Spinal Cord Stimulator:N/A  Bowel Prep - N/A  Sleep Study - yes CPAP - no per pt  Fasting Blood Sugar - 200 Checks Blood Sugar every once in awhile  Last dose of GLP1 agonist-  N/A GLP1 instructions:  Do not take after     Last dose of SGLT-2 inhibitors-  jardiance SGLT-2 instructions:  Do not take after  09/08/24   Blood Thinner Instructions: N/A Last dose:   Time: Aspirin Instructions:N/A Last Dose:  Activity level: Can go up a flight of stairs and perform activities of daily living without stopping and without symptoms of chest pain or shortness of breath. Ambulates with cane at end of day.   Anesthesia review: N/A  Patient denies shortness of breath, fever, cough and chest pain at PAT appointment  Patient verbalized understanding of instructions that were given to them at the PAT appointment. Patient was also instructed that they will need to review over the PAT instructions again at home before surgery.

## 2024-09-06 NOTE — Patient Instructions (Addendum)
 SURGICAL WAITING ROOM VISITATION  Patients having surgery or a procedure may have no more than 2 support people in the waiting area - these visitors may rotate.    Children under the age of 53 must have an adult with them who is not the patient.  Visitors with respiratory illnesses are discouraged from visiting and should remain at home.  If the patient needs to stay at the hospital during part of their recovery, the visitor guidelines for inpatient rooms apply. Pre-op nurse will coordinate an appropriate time for 1 support person to accompany patient in pre-op.  This support person may not rotate.    Please refer to the [facility] website for the visitor guidelines for Inpatients (after your surgery is over and you are in a regular room).    Your procedure is scheduled on:09/12/24   Report to [facility] Main Main Entrance    Report to admitting at 7:45 AM   Call this number if you have problems the morning of surgery [phone]   Do not eat food :After Midnight.   After Midnight you may have the following liquids until 7:15 AM DAY OF SURGERY  Water  Non-Citrus Juices (without pulp, NO RED-Apple, White grape, White cranberry) Black Coffee (NO MILK/CREAM OR CREAMERS, sugar ok)  Clear Tea (NO MILK/CREAM OR CREAMERS, sugar ok) regular and decaf                             Plain Jell-O (NO RED)                                           Fruit ices (not with fruit pulp, NO RED)                                     Popsicles (NO RED)                                                               Sports drinks like Gatorade (NO RED)                 The day of surgery:  Drink ONE (1) Pre-Surgery G2 at 7:15 AM the morning of surgery. Drink in one sitting. Do not sip.  This drink was given to you during your hospital  pre-op appointment visit. Nothing else to drink after completing the  Pre-Surgery G2.          If you have questions, please contact your surgeon's office.   FOLLOW  BOWEL PREP AND ANY ADDITIONAL PRE OP INSTRUCTIONS YOU RECEIVED FROM YOUR SURGEON'S OFFICE!!!     Oral Hygiene is also important to reduce your risk of infection.                                    Remember - BRUSH YOUR TEETH THE MORNING OF SURGERY WITH YOUR REGULAR TOOTHPASTE  DENTURES WILL BE REMOVED PRIOR TO SURGERY PLEASE DO NOT APPLY Poly grip OR ADHESIVES!!!  Stop all vitamins and herbal supplements 7 days before surgery.   Take these medicines the morning of surgery with A SIP OF WATER : Tylenol   DO NOT TAKE ANY ORAL DIABETIC MEDICATIONS DAY OF YOUR SURGERY  How to Manage Your Diabetes Before and After Surgery  Why is it important to control my blood sugar before and after surgery? Improving blood sugar levels before and after surgery helps healing and can limit problems. A way of improving blood sugar control is eating a healthy diet by:  Eating less sugar and carbohydrates  Increasing activity/exercise  Talking with your doctor about reaching your blood sugar goals High blood sugars (greater than 180 mg/dL) can raise your risk of infections and slow your recovery, so you will need to focus on controlling your diabetes during the weeks before surgery. Make sure that the doctor who takes care of your diabetes knows about your planned surgery including the date and location.  How do I manage my blood sugar before surgery? Check your blood sugar at least 4 times a day, starting 2 days before surgery, to make sure that the level is not too high or low. Check your blood sugar the morning of your surgery when you wake up and every 2 hours until you get to the Short Stay unit. If your blood sugar is less than 70 mg/dL, you will need to treat for low blood sugar: Do not take insulin. Treat a low blood sugar (less than 70 mg/dL) with  cup of clear juice (cranberry or apple), 4 glucose tablets, OR glucose gel. Recheck blood sugar in 15 minutes after treatment (to make sure it is  greater than 70 mg/dL). If your blood sugar is not greater than 70 mg/dL on recheck, call [phone] for further instructions. Report your blood sugar to the short stay nurse when you get to Short Stay.  If you are admitted to the hospital after surgery: Your blood sugar will be checked by the staff and you will probably be given insulin after surgery (instead of oral diabetes medicines) to make sure you have good blood sugar levels. The goal for blood sugar control after surgery is 80-180 mg/dL.   WHAT DO I DO ABOUT MY DIABETES MEDICATION?  Do not take oral diabetes medicines (pills) the morning of surgery.  Hold Jardiance for 3 days. Do not take after 09/08/24.    Reviewed and Endorsed by [facility] Patient Education Committee, August 2015                              You may not have any metal on your body including hair pins, jewelry, and body piercing             Do not wear lotions, powders, cologne, or deodorant              Men may shave face and neck.   Do not bring valuables to the hospital. [facility] IS NOT             RESPONSIBLE   FOR VALUABLES.   Contacts, glasses, dentures or bridgework may not be worn into surgery.  DO NOT BRING YOUR HOME MEDICATIONS TO THE HOSPITAL. PHARMACY WILL DISPENSE MEDICATIONS LISTED ON YOUR MEDICATION LIST TO YOU DURING YOUR ADMISSION IN THE HOSPITAL!    Patients discharged on the day of surgery will not be allowed to drive home.  Someone NEEDS to stay with you for the first  24 hours after anesthesia.              Please read over the following fact sheets you were given: IF YOU HAVE QUESTIONS ABOUT YOUR PRE-OP INSTRUCTIONS PLEASE CALL [phone].   If you received a COVID test during your pre-op visit  it is requested that you wear a mask when out in public, stay away from anyone that may not be feeling well and notify your surgeon if you develop symptoms. If you test positive for Covid or have been in contact with anyone  that has tested positive in the last 10 days please notify you surgeon.      Pre-operative 5 CHG Bath Instructions   You can play a key role in reducing the risk of infection after surgery. Your skin needs to be as free of germs as possible. You can reduce the number of germs on your skin by washing with CHG (chlorhexidine  gluconate) soap before surgery. CHG is an antiseptic soap that kills germs and continues to kill germs even after washing.   DO NOT use if you have an allergy to chlorhexidine /CHG or antibacterial soaps. If your skin becomes reddened or irritated, stop using the CHG and notify one of our RNs at [phone].   Please shower with the CHG soap starting 4 days before surgery using the following schedule:     Please keep in mind the following:  DO NOT shave, including legs and underarms, starting the day of your first shower.   You may shave your face at any point before/day of surgery.  Place clean sheets on your bed the day you start using CHG soap. Use a clean washcloth (not used since being washed) for each shower. DO NOT sleep with pets once you start using the CHG.   CHG Shower Instructions:  If you choose to wash your hair and private area, wash first with your normal shampoo/soap.  After you use shampoo/soap, rinse your hair and body thoroughly to remove shampoo/soap residue.  Turn the water  OFF and apply about 3 tablespoons (45 ml) of CHG soap to a CLEAN washcloth.  Apply CHG soap ONLY FROM YOUR NECK DOWN TO YOUR TOES (washing for 3-5 minutes)  DO NOT use CHG soap on face, private areas, open wounds, or sores.  Pay special attention to the area where your surgery is being performed.  If you are having back surgery, having someone wash your back for you may be helpful. Wait 2 minutes after CHG soap is applied, then you may rinse off the CHG soap.  Pat dry with a clean towel  Put on clean clothes/pajamas   If you choose to wear lotion, please use ONLY the  CHG-compatible lotions on the back of this paper.     Additional instructions for the day of surgery: DO NOT APPLY any lotions, deodorants, cologne, or perfumes.   Put on clean/comfortable clothes.  Brush your teeth.  Ask your nurse before applying any prescription medications to the skin.      CHG Compatible Lotions   Aveeno Moisturizing lotion  Cetaphil Moisturizing Cream  Cetaphil Moisturizing Lotion  Clairol Herbal Essence Moisturizing Lotion, Dry Skin  Clairol Herbal Essence Moisturizing Lotion, Extra Dry Skin  Clairol Herbal Essence Moisturizing Lotion, Normal Skin  Curel Age Defying Therapeutic Moisturizing Lotion with Alpha Hydroxy  Curel Extreme Care Body Lotion  Curel Soothing Hands Moisturizing Hand Lotion  Curel Therapeutic Moisturizing Cream, Fragrance-Free  Curel Therapeutic Moisturizing Lotion, Fragrance-Free  Curel Therapeutic Moisturizing Lotion,  Original Formula  Eucerin Daily Replenishing Lotion  Eucerin Dry Skin Therapy Plus Alpha Hydroxy Crme  Eucerin Dry Skin Therapy Plus Alpha Hydroxy Lotion  Eucerin Original Crme  Eucerin Original Lotion  Eucerin Plus Crme Eucerin Plus Lotion  Eucerin TriLipid Replenishing Lotion  Keri Anti-Bacterial Hand Lotion  Keri Deep Conditioning Original Lotion Dry Skin Formula Softly Scented  Keri Deep Conditioning Original Lotion, Fragrance Free Sensitive Skin Formula  Keri Lotion Fast Absorbing Fragrance Free Sensitive Skin Formula  Keri Lotion Fast Absorbing Softly Scented Dry Skin Formula  Keri Original Lotion  Keri Skin Renewal Lotion Keri Silky Smooth Lotion  Keri Silky Smooth Sensitive Skin Lotion  Nivea Body Creamy Conditioning Oil  Nivea Body Extra Enriched Lotion  Nivea Body Original Lotion  Nivea Body Sheer Moisturizing Lotion Nivea Crme  Nivea Skin Firming Lotion  NutraDerm 30 Skin Lotion  NutraDerm Skin Lotion  NutraDerm Therapeutic Skin Cream  NutraDerm Therapeutic Skin Lotion  ProShield Protective  Hand Cream  Provon moisturizing lotion   View Pre-Surgery Education Videos:  [email or web address]     Incentive Spirometer  An incentive spirometer is a tool that can help keep your lungs clear and active. This tool measures how well you are filling your lungs with each breath. Taking long deep breaths may help reverse or decrease the chance of developing breathing (pulmonary) problems (especially infection) following: A long period of time when you are unable to move or be active. BEFORE THE PROCEDURE  If the spirometer includes an indicator to show your best effort, your nurse or respiratory therapist will set it to a desired goal. If possible, sit up straight or lean slightly forward. Try not to slouch. Hold the incentive spirometer in an upright position. INSTRUCTIONS FOR USE  Sit on the edge of your bed if possible, or sit up as far as you can in bed or on a chair. Hold the incentive spirometer in an upright position. Breathe out normally. Place the mouthpiece in your mouth and seal your lips tightly around it. Breathe in slowly and as deeply as possible, raising the piston or the ball toward the top of the column. Hold your breath for 3-5 seconds or for as long as possible. Allow the piston or ball to fall to the bottom of the column. Remove the mouthpiece from your mouth and breathe out normally. Rest for a few seconds and repeat Steps 1 through 7 at least 10 times every 1-2 hours when you are awake. Take your time and take a few normal breaths between deep breaths. The spirometer may include an indicator to show your best effort. Use the indicator as a goal to work toward during each repetition. After each set of 10 deep breaths, practice coughing to be sure your lungs are clear. If you have an incision (the cut made at the time of surgery), support your incision when coughing by placing a pillow or rolled up towels firmly  against it. Once you are able to get out of bed, walk around indoors and cough well. You may stop using the incentive spirometer when instructed by your caregiver.  RISKS AND COMPLICATIONS Take your time so you do not get dizzy or light-headed. If you are in pain, you may need to take or ask for pain medication before doing incentive spirometry. It is harder to take a deep breath if you are having pain. AFTER USE Rest and breathe slowly and easily. It can be helpful to keep track of a log of your progress. Your  caregiver can provide you with a simple table to help with this. If you are using the spirometer at home, follow these instructions: SEEK MEDICAL CARE IF:  You are having difficultly using the spirometer. You have trouble using the spirometer as often as instructed. Your pain medication is not giving enough relief while using the spirometer. You develop fever of 100.5 F (38.1 C) or higher. SEEK IMMEDIATE MEDICAL CARE IF:  You cough up bloody sputum that had not been present before. You develop fever of 102 F (38.9 C) or greater. You develop worsening pain at or near the incision site. MAKE SURE YOU:  Understand these instructions. Will watch your condition. Will get help right away if you are not doing well or get worse. Document Released: 04/05/2007 Document Revised: 02/15/2012 Document Reviewed: 06/06/2007 ExitCare Patient Information 2014 ExitCare, [place].   ________________________________________________________________________ WHAT IS A BLOOD TRANSFUSION? Blood Transfusion Information  A transfusion is the replacement of blood or some of its parts. Blood is made up of multiple cells which provide different functions. Red blood cells carry oxygen and are used for blood loss replacement. White blood cells fight against infection. Platelets control bleeding. Plasma helps clot blood. Other blood products are available for specialized needs, such as hemophilia or other  clotting disorders. BEFORE THE TRANSFUSION  Who gives blood for transfusions?  Healthy volunteers who are fully evaluated to make sure their blood is safe. This is blood bank blood. Transfusion therapy is the safest it has ever been in the practice of medicine. Before blood is taken from a donor, a complete history is taken to make sure that person has no history of diseases nor engages in risky social behavior (examples are intravenous drug use or sexual activity with multiple partners). The donor's travel history is screened to minimize risk of transmitting infections, such as malaria. The donated blood is tested for signs of infectious diseases, such as HIV and hepatitis. The blood is then tested to be sure it is compatible with you in order to minimize the chance of a transfusion reaction. If you or a relative donates blood, this is often done in anticipation of surgery and is not appropriate for emergency situations. It takes many days to process the donated blood. RISKS AND COMPLICATIONS Although transfusion therapy is very safe and saves many lives, the main dangers of transfusion include:  Getting an infectious disease. Developing a transfusion reaction. This is an allergic reaction to something in the blood you were given. Every precaution is taken to prevent this. The decision to have a blood transfusion has been considered carefully by your caregiver before blood is given. Blood is not given unless the benefits outweigh the risks. AFTER THE TRANSFUSION Right after receiving a blood transfusion, you will usually feel much better and more energetic. This is especially true if your red blood cells have gotten low (anemic). The transfusion raises the level of the red blood cells which carry oxygen, and this usually causes an energy increase. The nurse administering the transfusion will monitor you carefully for complications. HOME CARE INSTRUCTIONS  No special instructions are needed after a  transfusion. You may find your energy is better. Speak with your caregiver about any limitations on activity for underlying diseases you may have. SEEK MEDICAL CARE IF:  Your condition is not improving after your transfusion. You develop redness or irritation at the intravenous (IV) site. SEEK IMMEDIATE MEDICAL CARE IF:  Any of the following symptoms occur over the next 12 hours: Shaking chills.  You have a temperature by mouth above 102 F (38.9 C), not controlled by medicine. Chest, back, or muscle pain. People around you feel you are not acting correctly or are confused. Shortness of breath or difficulty breathing. Dizziness and fainting. You get a rash or develop hives. You have a decrease in urine output. Your urine turns a dark color or changes to pink, red, or brown. Any of the following symptoms occur over the next 10 days: You have a temperature by mouth above 102 F (38.9 C), not controlled by medicine. Shortness of breath. Weakness after normal activity. The white part of the eye turns yellow (jaundice). You have a decrease in the amount of urine or are urinating less often. Your urine turns a dark color or changes to pink, red, or brown. Document Released: 11/20/2000 Document Revised: 02/15/2012 Document Reviewed: 07/09/2008 [facility] Patient Information 2014 [place], [place].  _______________________________________________________________________

## 2024-09-07 NOTE — Care Plan (Signed)
 Ortho Bundle Case Management Note  Patient Details  Name: William Harvey MRN: [id number] Date of Birth: 07-17-1956   met with patient and wife in the office for H&P will discharge to home with family to assist. rolling walker ordered. OPPT set up with [facility]. discharge instructions discussed and questions answered. Patient and MD in agreement with plan. Choice offered.                   DME Arranged:  Vannie rolling DME Agency:  [employer]  HH Arranged:    HH Agency:     Additional Comments: Please contact me with any questions of if this plan should need to change.  Charlies Pitch,  RN,BSN,MHA,CCM  [facility] Orthopaedic Specialist  [phone] 09/07/2024, 2:07 PM

## 2024-09-11 ENCOUNTER — Encounter (HOSPITAL_COMMUNITY): Payer: Self-pay | Admitting: Orthopedic Surgery

## 2024-09-11 NOTE — Progress Notes (Signed)
 Clients Significant Other phoned Short Stay, confirming change in arrival time. Updated of new arrival time of 0530hrs for 0730 Posted Procedural Time. Also clarified what medication client is to take morning of surgery, and also informed to complete presurgical drink. Also, discussed new NPO TIMES, to be NPO after midnight of all solids and liquids expect high carb beverage. Also verified pt's pharmacy of choice.

## 2024-09-11 NOTE — Progress Notes (Signed)
 William Harvey                                          MRN: [id number]   09/11/2024   The VBCI Quality Team Specialist reviewed this patient medical record for the purposes of chart review for care gap closure. The following were reviewed: chart review for care gap closure-kidney health evaluation for diabetes:uACR.    VBCI Quality Team

## 2024-09-12 ENCOUNTER — Encounter (HOSPITAL_COMMUNITY): Payer: Self-pay | Admitting: Orthopedic Surgery

## 2024-09-12 ENCOUNTER — Other Ambulatory Visit: Payer: Self-pay

## 2024-09-12 ENCOUNTER — Ambulatory Visit (HOSPITAL_COMMUNITY)

## 2024-09-12 ENCOUNTER — Ambulatory Visit (HOSPITAL_COMMUNITY)
Admission: RE | Admit: 2024-09-12 | Discharge: 2024-09-12 | Disposition: A | Discharge status: Home / Self Care | Attending: Orthopedic Surgery | Admitting: Orthopedic Surgery

## 2024-09-12 ENCOUNTER — Ambulatory Visit (HOSPITAL_BASED_OUTPATIENT_CLINIC_OR_DEPARTMENT_OTHER): Admitting: Anesthesiology

## 2024-09-12 ENCOUNTER — Encounter (HOSPITAL_COMMUNITY)
Admission: RE | Disposition: A | Discharge status: Home / Self Care | Payer: Self-pay | Source: Home / Self Care | Attending: Orthopedic Surgery

## 2024-09-12 ENCOUNTER — Ambulatory Visit (HOSPITAL_COMMUNITY): Payer: Self-pay | Admitting: Medical

## 2024-09-12 DIAGNOSIS — E1122 Type 2 diabetes mellitus with diabetic chronic kidney disease: Secondary | ICD-10-CM | POA: Diagnosis not present

## 2024-09-12 DIAGNOSIS — E119 Type 2 diabetes mellitus without complications: Secondary | ICD-10-CM

## 2024-09-12 DIAGNOSIS — Z79899 Other long term (current) drug therapy: Secondary | ICD-10-CM | POA: Diagnosis not present

## 2024-09-12 DIAGNOSIS — N189 Chronic kidney disease, unspecified: Secondary | ICD-10-CM | POA: Insufficient documentation

## 2024-09-12 DIAGNOSIS — Z833 Family history of diabetes mellitus: Secondary | ICD-10-CM | POA: Insufficient documentation

## 2024-09-12 DIAGNOSIS — R519 Headache, unspecified: Secondary | ICD-10-CM | POA: Diagnosis not present

## 2024-09-12 DIAGNOSIS — M1611 Unilateral primary osteoarthritis, right hip: Secondary | ICD-10-CM | POA: Insufficient documentation

## 2024-09-12 DIAGNOSIS — Z791 Long term (current) use of non-steroidal anti-inflammatories (NSAID): Secondary | ICD-10-CM | POA: Insufficient documentation

## 2024-09-12 DIAGNOSIS — Z7984 Long term (current) use of oral hypoglycemic drugs: Secondary | ICD-10-CM | POA: Insufficient documentation

## 2024-09-12 DIAGNOSIS — Z87442 Personal history of urinary calculi: Secondary | ICD-10-CM | POA: Insufficient documentation

## 2024-09-12 DIAGNOSIS — K219 Gastro-esophageal reflux disease without esophagitis: Secondary | ICD-10-CM | POA: Diagnosis not present

## 2024-09-12 DIAGNOSIS — Z96641 Presence of right artificial hip joint: Secondary | ICD-10-CM | POA: Insufficient documentation

## 2024-09-12 DIAGNOSIS — I129 Hypertensive chronic kidney disease with stage 1 through stage 4 chronic kidney disease, or unspecified chronic kidney disease: Secondary | ICD-10-CM | POA: Insufficient documentation

## 2024-09-12 DIAGNOSIS — G4730 Sleep apnea, unspecified: Secondary | ICD-10-CM | POA: Diagnosis not present

## 2024-09-12 DIAGNOSIS — Z87891 Personal history of nicotine dependence: Secondary | ICD-10-CM | POA: Insufficient documentation

## 2024-09-12 HISTORY — PX: TOTAL HIP ARTHROPLASTY: SHX124

## 2024-09-12 LAB — GLUCOSE, CAPILLARY
Glucose-Capillary: 174 mg/dL — ABNORMAL HIGH (ref 70–99)
Glucose-Capillary: 209 mg/dL — ABNORMAL HIGH (ref 70–99)
Glucose-Capillary: 168 mg/dL — ABNORMAL HIGH (ref 70–99)
Glucose-Capillary: 185 mg/dL — ABNORMAL HIGH (ref 70–99)

## 2024-09-12 LAB — TYPE AND SCREEN
ABO/RH(D): O POS
Antibody Screen: NEGATIVE

## 2024-09-12 LAB — ABO/RH: ABO/RH(D): O POS

## 2024-09-12 SURGERY — ARTHROPLASTY, HIP, TOTAL, ANTERIOR APPROACH
Anesthesia: General | Site: Hip | Laterality: Right

## 2024-09-12 MED ORDER — FENTANYL CITRATE (PF) 100 MCG/2ML IJ SOLN
INTRAMUSCULAR | Status: AC
  Filled 2024-09-12: qty 2

## 2024-09-12 MED ORDER — DEXMEDETOMIDINE HCL IN NACL 80 MCG/20ML IV SOLN
INTRAVENOUS | Status: DC | PRN
  Administered 2024-09-12 (×2): 8 ug via INTRAVENOUS

## 2024-09-12 MED ORDER — WATER FOR IRRIGATION, STERILE IR SOLN
Status: DC | PRN
  Administered 2024-09-12: 2000 mL

## 2024-09-12 MED ORDER — TRANEXAMIC ACID-NACL 1000-0.7 MG/100ML-% IV SOLN
INTRAVENOUS | Status: AC
  Filled 2024-09-12: qty 100

## 2024-09-12 MED ORDER — ONDANSETRON HCL 4 MG/2ML IJ SOLN: 4.0000 mg | Freq: Once | INTRAMUSCULAR | Status: DC | PRN

## 2024-09-12 MED ORDER — DEXMEDETOMIDINE HCL IN NACL 80 MCG/20ML IV SOLN
INTRAVENOUS | Status: AC
  Filled 2024-09-12: qty 40

## 2024-09-12 MED ORDER — HYDROMORPHONE HCL 1 MG/ML IJ SOLN
0.5000 mg | INTRAMUSCULAR | Status: AC | PRN
  Administered 2024-09-12 (×4): 0.5 mg via INTRAVENOUS

## 2024-09-12 MED ORDER — METHOCARBAMOL 750 MG PO TABS: 750.0000 mg | ORAL_TABLET | Freq: Three times a day (TID) | ORAL | 0 refills | Status: AC | PRN

## 2024-09-12 MED ORDER — DEXAMETHASONE SODIUM PHOSPHATE 10 MG/ML IJ SOLN: 8.0000 mg | Freq: Once | INTRAMUSCULAR | Status: DC

## 2024-09-12 MED ORDER — TRANEXAMIC ACID-NACL 1000-0.7 MG/100ML-% IV SOLN
1000.0000 mg | Freq: Once | INTRAVENOUS | Status: AC
  Administered 2024-09-12: 1000 mg via INTRAVENOUS

## 2024-09-12 MED ORDER — ACETAMINOPHEN 500 MG PO TABS: 1000.0000 mg | ORAL_TABLET | Freq: Four times a day (QID) | ORAL | 0 refills | Status: AC | PRN

## 2024-09-12 MED ORDER — LACTATED RINGERS IV BOLUS: 250.0000 mL | Freq: Once | INTRAVENOUS | Status: DC

## 2024-09-12 MED ORDER — CEFAZOLIN SODIUM-DEXTROSE 2-4 GM/100ML-% IV SOLN
2.0000 g | INTRAVENOUS | Status: AC
  Administered 2024-09-12: 2 g via INTRAVENOUS
  Filled 2024-09-12: qty 100

## 2024-09-12 MED ORDER — ONDANSETRON 4 MG PO TBDP: 4.0000 mg | ORAL_TABLET | Freq: Three times a day (TID) | ORAL | 0 refills | Status: AC | PRN

## 2024-09-12 MED ORDER — ACETAMINOPHEN 10 MG/ML IV SOLN
1000.0000 mg | Freq: Once | INTRAVENOUS | Status: DC | PRN
  Administered 2024-09-12: 1000 mg via INTRAVENOUS

## 2024-09-12 MED ORDER — METHOCARBAMOL 1000 MG/10ML IJ SOLN: 500.0000 mg | Freq: Four times a day (QID) | INTRAMUSCULAR | Status: DC | PRN

## 2024-09-12 MED ORDER — SENNA-DOCUSATE SODIUM 8.6-50 MG PO TABS: 2.0000 | ORAL_TABLET | Freq: Every day | ORAL | 1 refills | Status: AC | PRN

## 2024-09-12 MED ORDER — BUPIVACAINE LIPOSOME 1.3 % IJ SUSP
INTRAMUSCULAR | Status: AC
  Filled 2024-09-12: qty 10

## 2024-09-12 MED ORDER — OXYCODONE HCL 5 MG PO TABS: 10.0000 mg | ORAL_TABLET | ORAL | Status: DC | PRN

## 2024-09-12 MED ORDER — HYDROMORPHONE HCL 1 MG/ML IJ SOLN: 0.5000 mg | INTRAMUSCULAR | Status: DC | PRN

## 2024-09-12 MED ORDER — FENTANYL CITRATE PF 50 MCG/ML IJ SOSY
100.0000 ug | PREFILLED_SYRINGE | Freq: Once | INTRAMUSCULAR | Status: AC
  Administered 2024-09-12: 50 ug via INTRAVENOUS

## 2024-09-12 MED ORDER — MEPIVACAINE HCL (PF) 2 % IJ SOLN
INTRAMUSCULAR | Status: AC
  Filled 2024-09-12: qty 20

## 2024-09-12 MED ORDER — POVIDONE-IODINE 10 % EX SWAB: 2.0000 | Freq: Once | CUTANEOUS | Status: DC

## 2024-09-12 MED ORDER — PROPOFOL 10 MG/ML IV BOLUS
INTRAVENOUS | Status: AC
Start: 2024-09-12 — End: 2024-09-12
  Filled 2024-09-12: qty 20

## 2024-09-12 MED ORDER — LIDOCAINE HCL (PF) 2 % IJ SOLN
INTRAMUSCULAR | Status: AC
  Filled 2024-09-12: qty 5

## 2024-09-12 MED ORDER — SODIUM CHLORIDE (PF) 0.9 % IJ SOLN
INTRAMUSCULAR | Status: AC
  Filled 2024-09-12: qty 10

## 2024-09-12 MED ORDER — EPHEDRINE 5 MG/ML INJ
INTRAVENOUS | Status: AC
  Filled 2024-09-12: qty 5

## 2024-09-12 MED ORDER — HYDROMORPHONE HCL 1 MG/ML IJ SOLN
INTRAMUSCULAR | Status: AC
  Filled 2024-09-12: qty 2

## 2024-09-12 MED ORDER — OXYCODONE HCL 5 MG PO TABS
ORAL_TABLET | ORAL | Status: AC
  Filled 2024-09-12: qty 1

## 2024-09-12 MED ORDER — OXYCODONE HCL 5 MG PO TABS
5.0000 mg | ORAL_TABLET | Freq: Once | ORAL | Status: AC | PRN
  Administered 2024-09-12: 5 mg via ORAL

## 2024-09-12 MED ORDER — POVIDONE-IODINE 10 % EX SWAB
2.0000 | Freq: Once | CUTANEOUS | Status: DC
Start: 2024-09-12 — End: 2024-09-12

## 2024-09-12 MED ORDER — PROPOFOL 10 MG/ML IV BOLUS
INTRAVENOUS | Status: AC
  Filled 2024-09-12: qty 20

## 2024-09-12 MED ORDER — INSULIN ASPART 100 UNIT/ML IJ SOLN
0.0000 [IU] | INTRAMUSCULAR | Status: AC | PRN
  Administered 2024-09-12: 2 [IU] via SUBCUTANEOUS
  Administered 2024-09-12: 4 [IU] via SUBCUTANEOUS
  Filled 2024-09-12: qty 1

## 2024-09-12 MED ORDER — ACETAMINOPHEN 500 MG PO TABS
1000.0000 mg | ORAL_TABLET | Freq: Once | ORAL | Status: AC
  Administered 2024-09-12: 1000 mg via ORAL
  Filled 2024-09-12: qty 2

## 2024-09-12 MED ORDER — ASPIRIN 81 MG PO TBEC: 81.0000 mg | DELAYED_RELEASE_TABLET | Freq: Two times a day (BID) | ORAL | 0 refills | Status: AC

## 2024-09-12 MED ORDER — SODIUM CHLORIDE FLUSH 0.9 % IV SOLN
INTRAVENOUS | Status: DC | PRN
  Administered 2024-09-12: 10 mL

## 2024-09-12 MED ORDER — PROPOFOL 10 MG/ML IV BOLUS
INTRAVENOUS | Status: DC | PRN
  Administered 2024-09-12: 50 mg via INTRAVENOUS
  Administered 2024-09-12: 75 ug/kg/min via INTRAVENOUS
  Administered 2024-09-12: 150 mg via INTRAVENOUS

## 2024-09-12 MED ORDER — CEFAZOLIN SODIUM-DEXTROSE 2-4 GM/100ML-% IV SOLN
2.0000 g | Freq: Four times a day (QID) | INTRAVENOUS | Status: DC
  Administered 2024-09-12: 2 g via INTRAVENOUS

## 2024-09-12 MED ORDER — FENTANYL CITRATE (PF) 100 MCG/2ML IJ SOLN
INTRAMUSCULAR | Status: DC | PRN
  Administered 2024-09-12: 50 ug via INTRAVENOUS
  Administered 2024-09-12: 100 ug via INTRAVENOUS
  Administered 2024-09-12: 50 ug via INTRAVENOUS

## 2024-09-12 MED ORDER — OXYCODONE HCL 5 MG/5ML PO SOLN: 5.0000 mg | Freq: Once | ORAL | Status: AC | PRN

## 2024-09-12 MED ORDER — BUPIVACAINE LIPOSOME 1.3 % IJ SUSP: 10.0000 mL | Freq: Once | INTRAMUSCULAR | Status: DC

## 2024-09-12 MED ORDER — ONDANSETRON HCL 4 MG PO TABS: 4.0000 mg | ORAL_TABLET | Freq: Four times a day (QID) | ORAL | Status: DC | PRN

## 2024-09-12 MED ORDER — PROPOFOL 1000 MG/100ML IV EMUL
INTRAVENOUS | Status: AC
  Filled 2024-09-12: qty 100

## 2024-09-12 MED ORDER — OXYCODONE HCL ER 10 MG PO T12A: 10.0000 mg | EXTENDED_RELEASE_TABLET | Freq: Two times a day (BID) | ORAL | Status: DC

## 2024-09-12 MED ORDER — ONDANSETRON HCL 4 MG/2ML IJ SOLN
4.0000 mg | Freq: Four times a day (QID) | INTRAMUSCULAR | Status: DC | PRN
  Administered 2024-09-12: 4 mg via INTRAVENOUS

## 2024-09-12 MED ORDER — METHOCARBAMOL 1000 MG/10ML IJ SOLN
1000.0000 mg | Freq: Once | INTRAMUSCULAR | Status: AC
  Administered 2024-09-12: 1000 mg via INTRAVENOUS

## 2024-09-12 MED ORDER — ACETAMINOPHEN 10 MG/ML IV SOLN
INTRAVENOUS | Status: AC
  Filled 2024-09-12: qty 100

## 2024-09-12 MED ORDER — TRANEXAMIC ACID-NACL 1000-0.7 MG/100ML-% IV SOLN
1000.0000 mg | INTRAVENOUS | Status: AC
Start: 2024-09-12 — End: 2024-09-12
  Administered 2024-09-12: 1000 mg via INTRAVENOUS
  Filled 2024-09-12: qty 100

## 2024-09-12 MED ORDER — CHLORHEXIDINE GLUCONATE 0.12 % MT SOLN
15.0000 mL | Freq: Once | OROMUCOSAL | Status: AC
  Administered 2024-09-12: 15 mL via OROMUCOSAL

## 2024-09-12 MED ORDER — MIDAZOLAM HCL 2 MG/2ML IJ SOLN
INTRAMUSCULAR | Status: AC
  Filled 2024-09-12: qty 2

## 2024-09-12 MED ORDER — LACTATED RINGERS IV SOLN: INTRAVENOUS | Status: DC

## 2024-09-12 MED ORDER — BUPIVACAINE LIPOSOME 1.3 % IJ SUSP
INTRAMUSCULAR | Status: DC | PRN
  Administered 2024-09-12: 10 mL

## 2024-09-12 MED ORDER — HYDROMORPHONE HCL 2 MG/ML IJ SOLN
INTRAMUSCULAR | Status: AC
  Filled 2024-09-12: qty 1

## 2024-09-12 MED ORDER — FENTANYL CITRATE PF 50 MCG/ML IJ SOSY
PREFILLED_SYRINGE | INTRAMUSCULAR | Status: AC
  Filled 2024-09-12: qty 2

## 2024-09-12 MED ORDER — HYDROMORPHONE HCL 1 MG/ML IJ SOLN
1.0000 mg | Freq: Once | INTRAMUSCULAR | Status: AC
  Administered 2024-09-12: 1 mg via INTRAVENOUS

## 2024-09-12 MED ORDER — HYDROMORPHONE HCL 1 MG/ML IJ SOLN
INTRAMUSCULAR | Status: DC | PRN
  Administered 2024-09-12 (×2): 1 mg via INTRAVENOUS

## 2024-09-12 MED ORDER — ONDANSETRON HCL 4 MG/2ML IJ SOLN
INTRAMUSCULAR | Status: AC
  Filled 2024-09-12: qty 2

## 2024-09-12 MED ORDER — ORAL CARE MOUTH RINSE: 15.0000 mL | Freq: Once | OROMUCOSAL | Status: AC

## 2024-09-12 MED ORDER — ACETAMINOPHEN 500 MG PO TABS: 1000.0000 mg | ORAL_TABLET | Freq: Four times a day (QID) | ORAL | Status: DC

## 2024-09-12 MED ORDER — FENTANYL CITRATE PF 50 MCG/ML IJ SOSY
25.0000 ug | PREFILLED_SYRINGE | INTRAMUSCULAR | Status: DC | PRN
  Administered 2024-09-12 (×3): 50 ug via INTRAVENOUS

## 2024-09-12 MED ORDER — FENTANYL CITRATE PF 50 MCG/ML IJ SOSY
PREFILLED_SYRINGE | INTRAMUSCULAR | Status: AC
  Filled 2024-09-12: qty 3

## 2024-09-12 MED ORDER — MIDAZOLAM HCL 5 MG/5ML IJ SOLN
INTRAMUSCULAR | Status: DC | PRN
  Administered 2024-09-12: 2 mg via INTRAVENOUS

## 2024-09-12 MED ORDER — NALOXONE HCL 4 MG/0.1ML NA LIQD: 1.0000 | Freq: Once | NASAL | 0 refills | Status: AC | PRN

## 2024-09-12 MED ORDER — OXYCODONE HCL 5 MG PO TABS
10.0000 mg | ORAL_TABLET | Freq: Once | ORAL | Status: AC
  Administered 2024-09-12: 10 mg via ORAL

## 2024-09-12 MED ORDER — HYDROMORPHONE HCL 1 MG/ML IJ SOLN
INTRAMUSCULAR | Status: AC
  Filled 2024-09-12: qty 1

## 2024-09-12 MED ORDER — OXYCODONE HCL 5 MG/5ML PO SOLN: 5.0000 mg | Freq: Once | ORAL | Status: DC

## 2024-09-12 MED ORDER — METHOCARBAMOL 500 MG PO TABS: 500.0000 mg | ORAL_TABLET | Freq: Four times a day (QID) | ORAL | Status: DC | PRN

## 2024-09-12 MED ORDER — DEXAMETHASONE SODIUM PHOSPHATE 10 MG/ML IJ SOLN
INTRAMUSCULAR | Status: DC | PRN
  Administered 2024-09-12: 6 mg via INTRAVENOUS

## 2024-09-12 MED ORDER — ONDANSETRON HCL 4 MG/2ML IJ SOLN
INTRAMUSCULAR | Status: DC | PRN
Start: 2024-09-12 — End: 2024-09-12
  Administered 2024-09-12: 4 mg via INTRAVENOUS

## 2024-09-12 MED ORDER — INSULIN ASPART 100 UNIT/ML IJ SOLN
INTRAMUSCULAR | Status: AC
  Filled 2024-09-12: qty 1

## 2024-09-12 MED ORDER — METHOCARBAMOL 1000 MG/10ML IJ SOLN
INTRAMUSCULAR | Status: AC
  Filled 2024-09-12: qty 10

## 2024-09-12 MED ORDER — EPHEDRINE SULFATE-NACL 50-0.9 MG/10ML-% IV SOSY
PREFILLED_SYRINGE | INTRAVENOUS | Status: DC | PRN
  Administered 2024-09-12 (×2): 7.5 mg via INTRAVENOUS
  Administered 2024-09-12: 10 mg via INTRAVENOUS

## 2024-09-12 MED ORDER — CEFAZOLIN SODIUM-DEXTROSE 2-4 GM/100ML-% IV SOLN
INTRAVENOUS | Status: AC
  Filled 2024-09-12: qty 100

## 2024-09-12 MED ORDER — DEXAMETHASONE SODIUM PHOSPHATE 10 MG/ML IJ SOLN
INTRAMUSCULAR | Status: AC
  Filled 2024-09-12: qty 1

## 2024-09-12 MED ORDER — OXYCODONE HCL 5 MG PO TABS
ORAL_TABLET | ORAL | Status: AC
  Filled 2024-09-12: qty 2

## 2024-09-12 MED ORDER — 0.9 % SODIUM CHLORIDE (POUR BTL) OPTIME
TOPICAL | Status: DC | PRN
  Administered 2024-09-12: 1000 mL

## 2024-09-12 MED ORDER — OXYCODONE HCL 5 MG PO TABS: 5.0000 mg | ORAL_TABLET | ORAL | 0 refills | Status: AC | PRN

## 2024-09-12 SURGICAL SUPPLY — 39 items
BAG COUNTER SPONGE SURGICOUNT (BAG) IMPLANT
BAG ZIPLOCK 12X15 (MISCELLANEOUS) IMPLANT
BIT DRILL TRIDENT 4X25 SU (BIT) IMPLANT
BLADE SAG 18X100X1.27 (BLADE) ×1 IMPLANT
CHLORAPREP W/TINT 26 (MISCELLANEOUS) ×1 IMPLANT
CLSR STERI-STRIP ANTIMIC 1/2X4 (GAUZE/BANDAGES/DRESSINGS) ×1 IMPLANT
COVER PERINEAL POST (MISCELLANEOUS) ×1 IMPLANT
COVER SURGICAL LIGHT HANDLE (MISCELLANEOUS) ×1 IMPLANT
DRAPE IMP U-DRAPE 54X76 (DRAPES) ×1 IMPLANT
DRAPE STERI IOBAN 125X83 (DRAPES) ×1 IMPLANT
DRAPE U-SHAPE 47X51 STRL (DRAPES) ×2 IMPLANT
DRSG MEPILEX POST OP 4X8 (GAUZE/BANDAGES/DRESSINGS) ×1 IMPLANT
ELECT PENCIL ROCKER SW 15FT (MISCELLANEOUS) ×1 IMPLANT
ELECT REM PT RETURN 15FT ADLT (MISCELLANEOUS) ×1 IMPLANT
GLOVE BIO SURGEON STRL SZ7.5 (GLOVE) ×1 IMPLANT
GLOVE BIOGEL PI IND STRL 7.5 (GLOVE) ×1 IMPLANT
GLOVE BIOGEL PI IND STRL 8 (GLOVE) ×1 IMPLANT
GLOVE SURG SYN 7.0 PF PI (GLOVE) ×1 IMPLANT
GOWN STRL REUS W/ TWL LRG LVL3 (GOWN DISPOSABLE) ×1 IMPLANT
GOWN STRL REUS W/ TWL XL LVL3 (GOWN DISPOSABLE) ×1 IMPLANT
HEAD BIOLOX HIP 36/-5 (Joint) IMPLANT
HOLDER FOLEY CATH W/STRAP (MISCELLANEOUS) IMPLANT
INSERT 0 DEG POLY 36 F (Miscellaneous) IMPLANT
KIT TURNOVER KIT A (KITS) ×1 IMPLANT
MANIFOLD NEPTUNE II (INSTRUMENTS) ×1 IMPLANT
NS IRRIG 1000ML POUR BTL (IV SOLUTION) ×1 IMPLANT
PACK ANTERIOR HIP CUSTOM (KITS) ×1 IMPLANT
PROTECTOR NERVE ULNAR (MISCELLANEOUS) ×1 IMPLANT
SCREW HEX LP 6.5X20 (Screw) IMPLANT
SHELL ACETAB TRIDENT 58 (Shell) IMPLANT
SPIKE FLUID TRANSFER (MISCELLANEOUS) ×2 IMPLANT
STEM STD OFFSET SZ7 38 (Stem) IMPLANT
SUT MNCRL AB 3-0 PS2 18 (SUTURE) ×1 IMPLANT
SUT VIC AB 0 CT1 36 (SUTURE) ×1 IMPLANT
SUT VIC AB 1 CT1 36 (SUTURE) ×1 IMPLANT
SUT VIC AB 2-0 CT1 TAPERPNT 27 (SUTURE) ×1 IMPLANT
TRAY FOLEY MTR SLVR 16FR STAT (SET/KITS/TRAYS/PACK) ×1 IMPLANT
TUBE SUCTION HIGH CAP CLEAR NV (SUCTIONS) ×1 IMPLANT
WATER STERILE IRR 1000ML POUR (IV SOLUTION) ×2 IMPLANT

## 2024-09-12 NOTE — Anesthesia Postprocedure Evaluation (Signed)
 Anesthesia Post Note  Patient: [name]  Procedure(s) Performed: ARTHROPLASTY, HIP, TOTAL, ANTERIOR APPROACH (Right: Hip)     Patient location during evaluation: PACU Anesthesia Type: General Level of consciousness: awake and alert Pain management: satisfactory to patient (Pain difficult to control but now appears improved) Vital Signs Assessment: post-procedure vital signs reviewed and stable Respiratory status: spontaneous breathing, nonlabored ventilation, respiratory function stable and patient connected to nasal cannula oxygen Cardiovascular status: blood pressure returned to baseline and stable Postop Assessment: no apparent nausea or vomiting Anesthetic complications: no   No notable events documented.  Last Vitals:  Vitals:   09/12/24 1316 09/12/24 1400  BP:  (!) 144/74  Pulse: 83 84  Resp:    Temp:    SpO2: 95% 95%    Last Pain:  Vitals:   09/12/24 1400  TempSrc:   PainSc: 4                  [name] Cornea

## 2024-09-12 NOTE — Interval H&P Note (Signed)
 History and Physical Interval Note:  09/12/2024 7:09 AM  William Harvey  has presented today for surgery, with the diagnosis of OA RIGHT HIP.  The various methods of treatment have been discussed with the patient and family. After consideration of risks, benefits and other options for treatment, the patient has consented to  Procedure(s): ARTHROPLASTY, HIP, TOTAL, ANTERIOR APPROACH (Right) as a surgical intervention.  The patient's history has been reviewed, patient examined, no change in status, stable for surgery.  I have reviewed the patient's chart and labs.  Questions were answered to the patient's satisfaction.     William Harvey

## 2024-09-12 NOTE — Anesthesia Preprocedure Evaluation (Signed)
 Anesthesia Evaluation  Patient identified by MRN, date of birth, ID band Patient awake    Reviewed: Allergy & Precautions, NPO status , Patient's Chart, lab work & pertinent test results, reviewed documented beta blocker date and time   History of Anesthesia Complications Negative for: history of anesthetic complications  Airway Mallampati: III  TM Distance: >3 FB     Dental  (+) Partial Upper   Pulmonary sleep apnea and Continuous Positive Airway Pressure Ventilation , neg COPD, former smoker   breath sounds clear to auscultation       Cardiovascular Exercise Tolerance: Good hypertension, (-) CAD, (-) Past MI, (-) Cardiac Stents and (-) CABG  Rhythm:Regular Rate:Normal     Neuro/Psych  Headaches, neg Seizures    GI/Hepatic ,GERD  ,,  Endo/Other  diabetes, Type 2    Renal/GU CRFRenal disease     Musculoskeletal  (+) Arthritis , Osteoarthritis,    Abdominal   Peds  Hematology   Anesthesia Other Findings   Reproductive/Obstetrics                              Anesthesia Physical Anesthesia Plan  ASA: 2  Anesthesia Plan: Spinal   Post-op Pain Management: Regional block*   Induction:   PONV Risk Score and Plan: 1 and Ondansetron  and Propofol  infusion  Airway Management Planned: Natural Airway and Simple Face Mask  Additional Equipment:   Intra-op Plan:   Post-operative Plan:   Informed Consent: I have reviewed the patients History and Physical, chart, labs and discussed the procedure including the risks, benefits and alternatives for the proposed anesthesia with the patient or authorized representative who has indicated his/her understanding and acceptance.     Dental advisory given  Plan Discussed with: CRNA  Anesthesia Plan Comments:          Anesthesia Quick Evaluation

## 2024-09-12 NOTE — Evaluation (Signed)
 Physical Therapy Evaluation Patient Details Name: William Harvey MRN: [id number] DOB: 02-23-1956 Today's Date: 09/12/2024  History of Present Illness  68 y.o. male admitted 09/12/24 for R AA-THA. PMH: gout, HTN, CKD, DM.  Clinical Impression  Pt is s/p THA resulting in the deficits listed below (see PT Problem List). Pt ambulated 100' with RW, no loss of balance. STair training completed. Pt demonstrates good understanding of HEP. He is ready to DC home from a PT standpoint.         If plan is discharge home, recommend the following: Assist for transportation;Help with stairs or ramp for entrance   Can travel by private vehicle        Equipment Recommendations Rolling walker (2 wheels)  Recommendations for Other Services       Functional Status Assessment Patient has had a recent decline in their functional status and demonstrates the ability to make significant improvements in function in a reasonable and predictable amount of time.     Precautions / Restrictions Precautions Precautions: Fall Recall of Precautions/Restrictions: Intact Restrictions Weight Bearing Restrictions Per Provider Order: No Other Position/Activity Restrictions: WBAT      Mobility  Bed Mobility Overal bed mobility: Modified Independent             General bed mobility comments: HOB up, self assisted RLE with LLE    Transfers Overall transfer level: Needs assistance Equipment used: Rolling walker (2 wheels) Transfers: Sit to/from Stand Sit to Stand: Contact guard assist           General transfer comment: VCs hand placement    Ambulation/Gait Ambulation/Gait assistance: Supervision Gait Distance (Feet): 100 Feet Assistive device: Rolling walker (2 wheels) Gait Pattern/deviations: Step-to pattern, Decreased step length - right, Decreased step length - left       General Gait Details: VCs sequencing, no loss of balance  Stairs Stairs: Yes Stairs assistance: Contact guard  assist Stair Management: Two rails, Forwards Number of Stairs: 2 General stair comments: 2 rails forwards for 2 steps and then 1 step backwards with RW, VCs sequencing  Wheelchair Mobility     Tilt Bed    Modified Rankin (Stroke Patients Only)       Balance Overall balance assessment: Modified Independent                                           Pertinent Vitals/Pain Pain Assessment Pain Assessment: 0-10 Pain Score: 5  Pain Location: R hip with walking Pain Descriptors / Indicators: Sore Pain Intervention(s): Limited activity within patient's tolerance, Premedicated before session, Monitored during session, Ice applied    Home Living Family/patient expects to be discharged to:: Private residence Living Arrangements: Spouse/significant other Available Help at Discharge: Family Type of Home: House Home Access: Stairs to enter   Secretary/administrator of Steps: 1 Alternate Level Stairs-Number of Steps: 3 Home Layout: Two level Home Equipment: Rollator (4 wheels);Cane - single point;Toilet riser;Lift chair      Prior Function Prior Level of Function : Independent/Modified Independent             Mobility Comments: walks with Rollator or cane, no falls, drives a tow truck ADLs Comments: independent     Extremity/Trunk Assessment   Upper Extremity Assessment Upper Extremity Assessment: Overall WFL for tasks assessed    Lower Extremity Assessment Lower Extremity Assessment: RLE deficits/detail RLE Deficits / Details: hip +2/5,  Hip AAROM [facility] RLE Sensation: WNL    Cervical / Trunk Assessment Cervical / Trunk Assessment: Normal  Communication   Communication Communication: No apparent difficulties    Cognition Arousal: Alert Behavior During Therapy: WFL for tasks assessed/performed   PT - Cognitive impairments: No apparent impairments                                 Cueing       General Comments      Exercises  Total Joint Exercises Ankle Circles/Pumps: AROM, Both, 10 reps, Supine Quad Sets: AROM, Both, 5 reps, Supine Short Arc Quad: AROM, Right, 5 reps, Supine Heel Slides: AAROM, Right, 5 reps, Supine Hip ABduction/ADduction: AAROM, Right, 5 reps, Supine Long Arc Quad: AROM, Right, 5 reps, Seated   Assessment/Plan    PT Assessment All further PT needs can be met in the next venue of care  PT Problem List         PT Treatment Interventions      PT Goals (Current goals can be found in the Care Plan section)  Acute Rehab PT Goals PT Goal Formulation: All assessment and education complete, DC therapy    Frequency       Co-evaluation               AM-PAC PT 6 Clicks Mobility  Outcome Measure Help needed turning from your back to your side while in a flat bed without using bedrails?: None Help needed moving from lying on your back to sitting on the side of a flat bed without using bedrails?: None Help needed moving to and from a bed to a chair (including a wheelchair)?: None Help needed standing up from a chair using your arms (e.g., wheelchair or bedside chair)?: None Help needed to walk in hospital room?: None Help needed climbing 3-5 steps with a railing? : A Little 6 Click Score: 23    End of Session Equipment Utilized During Treatment: Gait belt Activity Tolerance: Patient tolerated treatment well Patient left: in bed;with call bell/phone within reach;with family/visitor present Nurse Communication: Mobility status      Time: 8675-8590 PT Time Calculation (min) (ACUTE ONLY): 45 min   Charges:   PT Evaluation $PT Eval Moderate Complexity: 1 Mod PT Treatments $Gait Training: 8-22 mins $Therapeutic Exercise: 8-22 mins PT General Charges $$ ACUTE PT VISIT: 1 Visit         Sylvan Delon Copp PT 09/12/2024  Acute Rehabilitation Services  Office [phone]

## 2024-09-12 NOTE — Transfer of Care (Signed)
 Immediate Anesthesia Transfer of Care Note  Patient: William Harvey  Procedure(s) Performed: Procedure(s): ARTHROPLASTY, HIP, TOTAL, ANTERIOR APPROACH (Right)  Patient Location: PACU  Anesthesia Type:General  Level of Consciousness:  sedated, patient cooperative and responds to stimulation  Airway & Oxygen Therapy:Patient Spontanous Breathing and Patient connected to face mask oxgen  Post-op Assessment:  Report given to PACU RN and Post -op Vital signs reviewed and stable  Post vital signs:  Reviewed and stable  Last Vitals:  Vitals:   09/12/24 0550  BP: (!) 163/81  Pulse: 64  Resp: 17  Temp: 36.7 C  SpO2: 97%    Complications: No apparent anesthesia complications

## 2024-09-12 NOTE — Anesthesia Procedure Notes (Signed)
 Procedure Name: LMA Insertion Date/Time: 09/12/2024 8:00 AM  Performed by: Vincenzo Show, CRNAPre-anesthesia Checklist: Patient identified, Emergency Drugs available, Suction available, Patient being monitored and Timeout performed Patient Re-evaluated:Patient Re-evaluated prior to induction Oxygen Delivery Method: Circle system utilized Preoxygenation: Pre-oxygenation with 100% oxygen Induction Type: IV induction Ventilation: Mask ventilation without difficulty LMA: LMA inserted LMA Size: 4.0 Number of attempts: 1 Placement Confirmation: positive ETCO2 and breath sounds checked- equal and bilateral Tube secured with: Tape Dental Injury: Teeth and Oropharynx as per pre-operative assessment  Comments: Easy pass LMA.

## 2024-09-12 NOTE — Op Note (Signed)
 09/12/2024  9:04 AM  PATIENT:  William Harvey   MRN: [id number]  PRE-OPERATIVE DIAGNOSIS:  OA RIGHT HIP  POST-OPERATIVE DIAGNOSIS:  OA RIGHT HIP  PROCEDURE:  Procedure(s): ARTHROPLASTY, HIP, TOTAL, ANTERIOR APPROACH  PREOPERATIVE INDICATIONS:    William Harvey is an 68 y.o. male who has a diagnosis of <principal problem not specified> and elected for surgical management after failing conservative treatment.  The risks benefits and alternatives were discussed with the patient including but not limited to the risks of nonoperative treatment, versus surgical intervention including infection, bleeding, nerve injury, periprosthetic fracture, the need for revision surgery, dislocation, leg length discrepancy, blood clots, cardiopulmonary complications, morbidity, mortality, among others, and they were willing to proceed.     OPERATIVE REPORT     SURGEON:   Evalene JONETTA Chancy, MD    ASSISTANT:  Gerard Large, PA-C, she was present and scrubbed throughout the case, critical for completion in a timely fashion, and for retraction, instrumentation, and closure.     ANESTHESIA:  General    COMPLICATIONS:  None.     COMPONENTS:  [employer] insignia femur size 7 with a 36 mm -5 head ball and an acetabular shell size 58 with a  polyethylene liner    PROCEDURE IN DETAIL:   The patient was met in the holding area and  identified.  The appropriate hip was identified and marked at the operative site.  The patient was then transported to the OR  and  placed under anesthesia per that record.  At that point, the patient was  placed in the supine position and  secured to the operating room table and all bony prominences padded. He received pre-operative antibiotics    The operative lower extremity was prepped from the iliac crest to the distal leg.  Sterile draping was performed.  Time out was performed prior to incision.      Skin incision was made just 2 cm lateral to the ASIS  extending in line with the  tensor fascia lata. Electrocautery was used to control all bleeders. I dissected down sharply to the fascia of the tensor fascia lata was confirmed that the muscle fibers beneath were running posteriorly. I then incised the fascia over the superficial tensor fascia lata in line with the incision. The fascia was elevated off the anterior aspect of the muscle the muscle was retracted posteriorly and protected throughout the case. I then used electrocautery to incise the tensor fascia lata fascia control and all bleeders. Immediately visible was the fat over top of the anterior neck and capsule.  I removed the anterior fat from the capsule and elevated the rectus muscle off of the anterior capsule. I then removed a large time of capsule. The retractors were then placed over the anterior acetabulum as well as around the superior and inferior neck.  I then made a femoral neck cut. Then used the power corkscrew to remove the femoral head from the acetabulum and thoroughly irrigated the acetabulum. I sized the femoral head.    I then exposed the deep acetabulum, cleared out any tissue including the ligamentum teres.   After adequate visualization, I excised the labrum, and then sequentially reamed.  I then impacted the acetabular implant into place using fluoroscopy for guidance.  Appropriate version and inclination was confirmed clinically matching their bony anatomy, and with fluoroscopy.  I placed a 20 mm screw in the posterior/superio position with an excellent bite.    I then placed the polyethylene liner in place  I then adducted the leg and released the external rotators from the posterior femur allowing it to be easily delivered up lateral and anterior to the acetabulum for preparation of the femoral canal.    I then prepared the proximal femur using the cookie-cutter and then sequentially reamed and broached.  A trial broach, neck, and head was utilized, and I reduced the hip and used floroscopy to  assess the neck length and femoral implant.  I then impacted the femoral prosthesis into place into the appropriate version. The hip was then reduced and fluoroscopy confirmed appropriate position. Leg lengths were restored.  I then irrigated the hip copiously again with, and repaired the fascia with Vicryl, followed by monocryl for the subcutaneous tissue, Monocryl for the skin, Steri-Strips and sterile gauze. The patient was then awakened and returned to PACU in stable and satisfactory condition. There were no complications.  POST OPERATIVE PLAN: WBAT, DVT px: SCD's/TED, ambulation and chemical dvt px  Evalene Chancy, MD Orthopedic Surgeon [phone]

## 2024-09-12 NOTE — Discharge Instructions (Addendum)

## 2024-09-14 ENCOUNTER — Encounter (HOSPITAL_COMMUNITY): Payer: Self-pay | Admitting: Orthopedic Surgery

## 2024-09-14 DIAGNOSIS — M25551 Pain in right hip: Secondary | ICD-10-CM | POA: Diagnosis not present

## 2024-09-19 DIAGNOSIS — M25551 Pain in right hip: Secondary | ICD-10-CM | POA: Diagnosis not present

## 2024-09-26 DIAGNOSIS — M25551 Pain in right hip: Secondary | ICD-10-CM | POA: Diagnosis not present

## 2024-09-27 DIAGNOSIS — Z96641 Presence of right artificial hip joint: Secondary | ICD-10-CM | POA: Diagnosis not present

## 2024-09-28 DIAGNOSIS — M25551 Pain in right hip: Secondary | ICD-10-CM | POA: Diagnosis not present

## 2024-10-05 DIAGNOSIS — M25551 Pain in right hip: Secondary | ICD-10-CM | POA: Diagnosis not present
